# Patient Record
Sex: Male | Born: 1990 | Race: White | Hispanic: No | Marital: Married | Smoking: Current every day smoker
Health system: Southern US, Community
[De-identification: ages and names within clinical notes are randomized; demographics above are authoritative.]

## PROBLEM LIST (undated history)

## (undated) DIAGNOSIS — G43909 Migraine, unspecified, not intractable, without status migrainosus: Secondary | ICD-10-CM

## (undated) DIAGNOSIS — F419 Anxiety disorder, unspecified: Secondary | ICD-10-CM

## (undated) DIAGNOSIS — K219 Gastro-esophageal reflux disease without esophagitis: Secondary | ICD-10-CM

## (undated) HISTORY — DX: Anxiety disorder, unspecified: F41.9

## (undated) HISTORY — DX: Migraine, unspecified, not intractable, without status migrainosus: G43.909

## (undated) HISTORY — DX: Gastro-esophageal reflux disease without esophagitis: K21.9

---

## 2009-09-19 ENCOUNTER — Emergency Department: Payer: Self-pay | Admitting: Unknown Physician Specialty

## 2010-09-04 ENCOUNTER — Emergency Department: Payer: Self-pay | Admitting: Internal Medicine

## 2011-07-17 LAB — COMPREHENSIVE METABOLIC PANEL
Albumin: 4.9 g/dL (ref 3.4–5.0)
Alkaline Phosphatase: 73 U/L (ref 50–136)
Anion Gap: 11 (ref 7–16)
BUN: 7 mg/dL (ref 7–18)
Bilirubin,Total: 0.8 mg/dL (ref 0.2–1.0)
Chloride: 104 mmol/L (ref 98–107)
Creatinine: 1 mg/dL (ref 0.60–1.30)
EGFR (African American): >60
Glucose: 76 mg/dL (ref 65–99)
Potassium: 3.8 mmol/L (ref 3.5–5.1)
SGOT(AST): 16 U/L (ref 15–37)
SGPT (ALT): 21 U/L
Total Protein: 8.6 g/dL — ABNORMAL HIGH (ref 6.4–8.2)

## 2011-07-17 LAB — ETHANOL
Ethanol %: 0.065 % (ref 0.000–0.080)
Ethanol: 65 mg/dL

## 2011-07-17 LAB — DRUG SCREEN, URINE
Amphetamines, Ur Screen: NEGATIVE (ref ?–1000)
Benzodiazepine, Ur Scrn: NEGATIVE (ref ?–200)
MDMA (Ecstasy)Ur Screen: NEGATIVE (ref ?–500)
Opiate, Ur Screen: NEGATIVE (ref ?–300)
Phencyclidine (PCP) Ur S: NEGATIVE (ref ?–25)

## 2011-07-17 LAB — CBC
HGB: 15.9 g/dL (ref 13.0–18.0)
MCH: 31 pg (ref 26.0–34.0)
MCHC: 33.7 g/dL (ref 32.0–36.0)
MCV: 92 fL (ref 80–100)
Platelet: 275 10*3/uL (ref 150–440)
RBC: 5.11 10*6/uL (ref 4.40–5.90)
RDW: 13.6 % (ref 11.5–14.5)

## 2011-07-17 LAB — SALICYLATE LEVEL: Salicylates, Serum: <1.7 mg/dL

## 2011-07-18 ENCOUNTER — Inpatient Hospital Stay: Payer: Self-pay | Admitting: Psychiatry

## 2012-01-18 ENCOUNTER — Emergency Department: Payer: Self-pay | Admitting: Unknown Physician Specialty

## 2013-03-22 ENCOUNTER — Ambulatory Visit: Payer: Self-pay | Admitting: Emergency Medicine

## 2013-12-05 ENCOUNTER — Ambulatory Visit: Payer: Self-pay | Admitting: Emergency Medicine

## 2013-12-05 LAB — COMPREHENSIVE METABOLIC PANEL
Albumin: 4.1 g/dL (ref 3.4–5.0)
Alkaline Phosphatase: 80 U/L
Anion Gap: 6 — ABNORMAL LOW (ref 7–16)
BUN: 12 mg/dL (ref 7–18)
Bilirubin,Total: 0.3 mg/dL (ref 0.2–1.0)
Calcium, Total: 9.3 mg/dL (ref 8.5–10.1)
Chloride: 103 mmol/L (ref 98–107)
Co2: 32 mmol/L (ref 21–32)
Creatinine: 0.81 mg/dL (ref 0.60–1.30)
EGFR (African American): >60
EGFR (Non-African Amer.): >60
Glucose: 99 mg/dL (ref 65–99)
Osmolality: 281 (ref 275–301)
Potassium: 3.8 mmol/L (ref 3.5–5.1)
SGOT(AST): 22 U/L (ref 15–37)
SGPT (ALT): 32 U/L (ref 12–78)
Sodium: 141 mmol/L (ref 136–145)
Total Protein: 7 g/dL (ref 6.4–8.2)

## 2013-12-05 LAB — CBC WITH DIFFERENTIAL/PLATELET
Basophil #: 0.1 10*3/uL (ref 0.0–0.1)
Basophil %: 1.1 %
Eosinophil #: 0.1 10*3/uL (ref 0.0–0.7)
Eosinophil %: 1.4 %
HCT: 39.5 % — ABNORMAL LOW (ref 40.0–52.0)
HGB: 13.2 g/dL (ref 13.0–18.0)
Lymphocyte #: 1.8 10*3/uL (ref 1.0–3.6)
Lymphocyte %: 28.8 %
MCH: 29.9 pg (ref 26.0–34.0)
MCHC: 33.5 g/dL (ref 32.0–36.0)
MCV: 89 fL (ref 80–100)
Monocyte #: 0.2 x10 3/mm (ref 0.2–1.0)
Monocyte %: 3.6 %
Neutrophil #: 4 10*3/uL (ref 1.4–6.5)
Neutrophil %: 65.1 %
Platelet: 233 10*3/uL (ref 150–440)
RBC: 4.43 10*6/uL (ref 4.40–5.90)
RDW: 13.2 % (ref 11.5–14.5)
WBC: 6.1 10*3/uL (ref 3.8–10.6)

## 2013-12-05 LAB — AMYLASE: Amylase: 47 U/L (ref 25–115)

## 2013-12-05 LAB — LIPASE, BLOOD: Lipase: 108 U/L (ref 73–393)

## 2014-11-02 NOTE — Consult Note (Signed)
Psychological Assessment  Max Levinsucker Best22of Evaluation: 1-7-13Administered: [facility] Multiphasic Personality Inventory-2 (MMPI-2) for Referral: [name] was referred for a psychological assessment by his physician, Jolanta Pucilowska.  He was admitted to Behavioral Medicine for treatment of suicidal ideation. Please see the history and physical and psychosocial history for further background information. An assessment of personality structure was requested. [name]?s MMPI-2 protocol is compared to that of other adult males he obtained the following profile: 34?[phone]:#. The MMPI-2 validity scales indicate that the clinical profile is valid. They also suggest that he has adjusted to the experience of chronic problems and is not currently in significant distress. Presentation Moods: He reports a mild level of emotional distress that is characterized by a general dissatisfaction with life and a dysphoric mood. He frequently worries about something. He will use indirect, passive means of expressing his anger. He reports that his attention, concentration and memory seem to be all right. He has strong opinions that he expresses directly to other people. He likes to let people know where he stands on things and he finds it necessary to stand up for what he thinks is right or if people do something that makes him angry. He is able to make decisions easily. At times his thoughts have raced ahead faster than he could speak them. He sometimes thinks that difficulties are piling up so high that he cannot overcome them. He does not analyze the motives for his own or others? feelings and behavior. He believes that it is safe to trust others.  Relations: He reports that he is extroverted. He is very sociable and makes friends quickly. He likes making decisions and assigning jobs to others and believes, if given the chance, he would make a good leader of people. He enjoys social gatherings and parties and the  excitement of a crowd. He finds it easy to talk when he meets new people or is in a group of people. He is concerned about what others think of him.  Problem Areas: He reports little concern about his health. He does not wake up fresh and rested most mornings. He tires quickly. At times he is all full of energy. He usually has enough energy to do his work. He is likely to abuse alcohol or drugs so a careful review should be made of the consequences of his alcohol and drug use. He may have had behavioral problems while he was in school and has been in trouble with the law.  His prognosis is very poor because his problems are characterologic and not readily amenable to change. He is experiencing minimal emotional distress and has little concern about his behavior, limiting his motivation for treatment. His tendency not to analyze the motives for his own or others? behavior complicates most interventions. Short-term, behavioral interventions that focus on his reasons for entering treatment will be most effective.   Diagnostic Impression:Mood Disorder NOSDisorder NOS with histrionic featuresof substance abuse   Electronic Signatures: Carola FrostRoush, Lee Ann (PsyD, HSP-P)  (Signed on 08-Jan-13 12:25)  Authored  Last Updated: 08-Jan-13 12:25 by Carola Frostoush, Lee Ann (PsyD, HSP-P)

## 2014-11-02 NOTE — H&P (Signed)
PATIENT NAME:  Max Wright, Max Wright MR#:  [id number] DATE OF BIRTH:  [birth year]  DATE OF ADMISSION:  07/18/2011  REFERRING PHYSICIAN: Dr. Daryel November.   ATTENDING PHYSICIAN: Dr. Braulio Conte B. Taylor Levick.   IDENTIFYING DATA: Max Wright is a 24 year old male with history of depression and substance abuse.   CHIEF COMPLAINT: "I was drunk."   HISTORY OF PRESENT ILLNESS: Max Wright was petitioned by his wife. Reportedly, prior to admission he was on a three-day cocaine binge; and  on the day of admission he got drunk on a bottle of wine and started threatening to kill himself with a knife. He reportedly talked to his family to say that they would not see him again. The wife took papers out. The patient reports that he has been under considerable stress for the past several months since their baby was born four months ago. He is afraid that the wife will leave him with the baby. The relationship is quite strained because of the baby. He continues to worry about her being unfaithful and financial problems. He reports poor sleep, decreased appetite, anhedonia, social isolation, feelings of guilt, hopelessness, worthlessness, poor memory and concentration, and heightened anxiety that culminates in panic attacks with palpitations, swelling, feeling of impending doom, difficulties breathing, that last from several minutes to hours. He reports that recently he relapsed on cocaine, which made his anxiety even worse. He denies frequent drinking and does not feel that alcohol is a problem. He denies illicit substance use except that he has been using marijuana daily. He denies psychotic symptoms but endorses symptoms suggestive of mood instability with mood swings, irritability, hyperactivity, insomnia, agitated behavior. He denies physical violence.   PAST PSYCHIATRIC HISTORY: He has been depressed since at least the age of 52. He was diagnosed with depression at the age of 57. He was tried on several medications, including  Zoloft, Prozac, Wellbutrin and Effexor. He feels that Effexor has been helpful in the past but makes him oversedated, and it is hard for him to work. He has not been on Effexor for a while. It was prescribed by Dr. Koren Bound at [facility], but the patient has no insurance and no longer can afford doctor's visits or medication. He denies ever being hospitalized. There were no suicide attempts. He has never been in substance abuse treatment.  FAMILY PSYCHIATRIC HISTORY:  He feels that his sister may be bipolar.   PAST MEDICAL HISTORY: None.   ALLERGIES: No known drug allergies.   MEDICATIONS ON ADMISSION: None.   SOCIAL HISTORY: He dropped out in the ninth grade and did not like school. He felt anxious, was skipping school a lot. He did get his GED. He has known his current wife for a year and a half. They have a 10-month-old baby son. 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 He lost this job several months ago and was rehired just last week. He is uncertain whether or not his sister will hire him back as he says, "She is my sister when she is my sister and my boss at work." His wife is employed. He considers moving in with his parents if his relationship with the wife does not improve. It is unclear what the problem is. He does not indicate that the wife has been inappropriate, rather it seems that his own behavior contributes to marital problems and financial ones.   REVIEW OF SYSTEMS: CONSTITUTIONAL: No fevers or chills. No weight changes. EYES: No double or  blurred vision. ENT: No hearing loss. RESPIRATORY: No shortness of breath or cough. CARDIOVASCULAR: No chest pain or orthopnea. GASTROINTESTINAL: No abdominal pain, nausea, vomiting, or diarrhea. GU: No incontinence or frequency. ENDOCRINE: No heat or cold intolerance. LYMPHATIC: No anemia or easy bruising. INTEGUMENTARY: No acne or rash. MUSCULOSKELETAL: No muscle or joint pain. NEUROLOGICAL: No tingling or weakness.  PSYCHIATRIC: See history of present illness for details.   PHYSICAL EXAMINATION:  VITAL SIGNS: Blood pressure 131/70, pulse 72, respirations 18, temperature 97.3.   GENERAL: This is a slender, anxious-appearing male in no acute distress.   HEENT: The pupils are equal, round, and reactive to light. Sclerae are anicteric.   NECK: Supple. No thyromegaly.   LUNGS: Clear to auscultation. No dullness to percussion.   HEART: Regular rhythm and rate. No murmurs, rubs, or gallops.  ABDOMEN: Soft, nontender, and nondistended.   MUSCULOSKELETAL: Normal muscle strength in all extremities.   SKIN: No rashes or bruises.   LYMPHATIC: No cervical adenopathy.   NEUROLOGICAL: Cranial nerves II through XII are intact.   LABORATORY, DIAGNOSTIC AND RADIOLOGICAL DATA:  Chemistries are within normal limits.  Blood alcohol level is 0.065.  LFTs are within normal limits. TSH is 0.653.  Urine toxicology screen positive for cocaine and cannabinoids.  CBC within normal limits.  Serum acetaminophen and salicylates are low.   MENTAL STATUS EXAMINATION ON ADMISSION: The patient is alert and oriented to person, place, time, and situation. He is pleasant, polite, and cooperative. He is well groomed and casually dressed. He maintains good eye contact. His speech is of normal rhythm, rate, and volume. Mood is anxious with full affect. Thought processing is logical and goal oriented. Thought content: He denies suicidal or homicidal ideation but was admitted after threatening to hurt himself with a knife while drunk. There are no delusions or paranoia. There are no auditory or visual hallucinations. His cognition is grossly intact. He registers three out of three and recalls three out of three objects after five minutes. He can spell world forward and backward. He knows three past Presidents. His insight and judgment seem fair today.   SUICIDE RISK ASSESSMENT ON ADMISSION: This is a patient with a long history of  depression, mood instability and substance abuse, who threatened suicide while drunk.   ASSESSMENT:  AXIS I:  1. Mood disorder, not otherwise specified.  2. Alcohol abuse.  3. Cocaine abuse.   AXIS II: Deferred.   AXIS III: None.   AXIS IV: Mental illness, treatment compliance, substance abuse, marital, financial, employment.   AXIS V: Global Assessment of Functioning score on admission 25.   PLAN: The patient was admitted to [facility] Medicine Unit for safety, stabilization and medication management. He was initially placed on suicide precautions and was closely monitored for any unsafe behaviors. He underwent full psychiatric and risk assessment. He received pharmacotherapy, individual and group psychotherapy, substance abuse counseling, and support from therapeutic milieu.   1. Suicidal ideation: This has resolved. The patient is able to contract for safety.  2. Alcohol detox: Dr. Guss Bunde,  who admitted the patient, placed him on a standard CIWA protocol. He did not receive any doses of Ativan. His vital signs are stable.  3. Mood: The patient tried several antidepressants in the past. Except for Effexor, nothing ever worked. He, however, feels that Effexor makes him too sleepy to work and costs over $100. He is unable to afford it at the moment. He endorses symptoms suggestive of mood instability. He agreed  to try Tegretol for mood stabilization.  4. Insomnia: We will start him on trazodone for sleep.  5. Substance abuse treatment: The patient has a long history of substance abuse, admits to smoking marijuana daily. He minimizes the role of substance abuse in his problems and declines treatment.  6. Diagnostic clarification: We will offer MMPI.  7. Family problems: We will attempt to have a family session with his wife.  8. Disposition: He will be discharged to home. He will need an appointment with a psychiatrist and a therapist, if possible.    ____________________________ Ellin GoodieJolanta B. Jennet MaduroPucilowska, MD jbp:cbb D: 07/18/2011 18:14:10 ET T: 07/18/2011 19:51:20 ET JOB#: [id number]  cc: Ebin Palazzi B. Jennet MaduroPucilowska, MD, <Dictator> Shari ProwsJOLANTA B Vivianna Piccini MD ELECTRONICALLY SIGNED 07/20/2011 23:35

## 2016-02-15 ENCOUNTER — Emergency Department
Admission: EM | Admit: 2016-02-15 | Discharge: 2016-02-15 | Disposition: A | Discharge status: Home / Self Care | Payer: BLUE CROSS/BLUE SHIELD | Attending: Emergency Medicine | Admitting: Emergency Medicine

## 2016-02-15 ENCOUNTER — Encounter: Payer: Self-pay | Admitting: Emergency Medicine

## 2016-02-15 DIAGNOSIS — T7840XA Allergy, unspecified, initial encounter: Secondary | ICD-10-CM | POA: Insufficient documentation

## 2016-02-15 DIAGNOSIS — R21 Rash and other nonspecific skin eruption: Secondary | ICD-10-CM | POA: Diagnosis present

## 2016-02-15 DIAGNOSIS — L509 Urticaria, unspecified: Secondary | ICD-10-CM

## 2016-02-15 MED ORDER — PREDNISONE 10 MG PO TABS: 10.0000 mg | ORAL_TABLET | Freq: Two times a day (BID) | ORAL | 0 refills | Status: DC

## 2016-02-15 MED ORDER — FAMOTIDINE IN NACL 20-0.9 MG/50ML-% IV SOLN
20.0000 mg | Freq: Once | INTRAVENOUS | Status: AC
  Administered 2016-02-15: 20 mg via INTRAVENOUS

## 2016-02-15 MED ORDER — RANITIDINE HCL 150 MG PO TABS: 150.0000 mg | ORAL_TABLET | Freq: Two times a day (BID) | ORAL | 0 refills | Status: DC

## 2016-02-15 MED ORDER — SODIUM CHLORIDE 0.9 % IV BOLUS (SEPSIS)
500.0000 mL | Freq: Once | INTRAVENOUS | Status: AC
  Administered 2016-02-15: 500 mL via INTRAVENOUS

## 2016-02-15 MED ORDER — FAMOTIDINE IN NACL 20-0.9 MG/50ML-% IV SOLN
INTRAVENOUS | Status: AC
  Administered 2016-02-15: 20 mg via INTRAVENOUS
  Filled 2016-02-15: qty 50

## 2016-02-15 MED ORDER — DIPHENHYDRAMINE HCL 50 MG/ML IJ SOLN
50.0000 mg | Freq: Once | INTRAMUSCULAR | Status: AC
  Administered 2016-02-15: 50 mg via INTRAVENOUS
  Filled 2016-02-15: qty 1

## 2016-02-15 MED ORDER — HYDROXYZINE PAMOATE 25 MG PO CAPS: 25.0000 mg | ORAL_CAPSULE | Freq: Three times a day (TID) | ORAL | 0 refills | Status: DC | PRN

## 2016-02-15 MED ORDER — METHYLPREDNISOLONE SODIUM SUCC 125 MG IJ SOLR
125.0000 mg | Freq: Once | INTRAMUSCULAR | Status: AC
  Administered 2016-02-15: 125 mg via INTRAVENOUS
  Filled 2016-02-15: qty 2

## 2016-02-15 NOTE — ED Triage Notes (Signed)
Patient ambulatory to triage with steady gait, without difficulty or distress noted; pt reports being bit by possible fire ant PTA with noted some hives; denies any difficulty breathing or swallowing

## 2016-02-15 NOTE — Discharge Instructions (Signed)
You appear to have had an allergic reaction brought on by multiple insect bites. You have been treated with intravenous antihistamines and steroids. You will be discharged with prescriptions for the same medicines to take by mouth. Increase fluid intake and follow-up with your primary care provider for continued symptoms. Return to the ED for difficulty swallowing or breathing.

## 2016-02-15 NOTE — ED Notes (Signed)
Pt with much improvement; skin with mild rash on upper trunk and blt axillaries; non pink in appearance.

## 2016-02-16 NOTE — ED Provider Notes (Signed)
[facility] Emergency Department Provider Note ____________________________________________  Time seen: 830 PM  I have reviewed the triage vital signs and the nursing notes.  HISTORY  Chief Complaint  Rash  HPI Max Wright is a 25 y.o. male presents to the ED accompanied by his wife for evaluation of hives of an unknown cause. The patient describes that he was in the yard today when he was apparently bitten by 2 or 3 fire ants to his lower leg. With than about 20 minutes of that exposure, the patient developed generalized hives from the lower extremities to the torso extremities tease and neck. He denies any previous history of allergies, and denies any previous history of idiopathic hives. He denies any difficulty swallowing, breathing, and denies any swelling of the lips, throat, or tongue. He is not taking any medication prior to arrival.  History reviewed. No pertinent past medical history.  There are no active problems to display for this patient.  History reviewed. No pertinent surgical history.  Prior to Admission medications   Medication Sig Start Date End Date Taking? Authorizing Provider  hydrOXYzine (VISTARIL) 25 MG capsule Take 1 capsule (25 mg total) by mouth 3 (three) times daily as needed for itching. 02/15/16   Lucah Petta V Bacon Anav Lammert, PA-C  predniSONE (DELTASONE) 10 MG tablet Take 1 tablet (10 mg total) by mouth 2 (two) times daily with a meal. 02/15/16   Maxton Noreen V Bacon Deserai Cansler, PA-C  ranitidine (ZANTAC) 150 MG tablet Take 1 tablet (150 mg total) by mouth 2 (two) times daily. 02/15/16   Mikailah Morel V Bacon Burk Hoctor, PA-C    Allergies Review of patient's allergies indicates no known allergies.  No family history on file.  Social History Social History  Substance Use Topics  . Smoking status: Never Smoker  . Smokeless tobacco: Never Used  . Alcohol use No   Review of Systems  Constitutional: Negative for fever. Eyes: Negative for visual  changes. ENT: Negative for sore throat. Cardiovascular: Negative for chest pain. Respiratory: Negative for shortness of breath. Skin: Positive for global rash. Neurological: Negative for headaches, focal weakness or numbness. ____________________________________________  PHYSICAL EXAM:  VITAL SIGNS: ED Triage Vitals  Enc Vitals Group     BP 02/15/16 2015 (!) 160/73     Pulse Rate 02/15/16 2015 67     Resp 02/15/16 2015 18     Temp 02/15/16 2015 98.3 F (36.8 C)     Temp Source 02/15/16 2204 Oral     SpO2 02/15/16 2015 100 %     Weight 02/15/16 2015 175 lb (79.4 kg)     Height 02/15/16 2015 5\' 11"  (1.803 m)     Head Circumference --      Peak Flow --      Pain Score --      Pain Loc --      Pain Edu? --      Excl. in GC? --    Constitutional: Alert and oriented. Well appearing and in no distress. Head: Normocephalic and atraumatic.      Eyes: Conjunctivae are normal. PERRL. Normal extraocular movements      Ears: Canals clear. TMs intact bilaterally.   Nose: No congestion/rhinorrhea.   Mouth/Throat: Mucous membranes are moist. Uvula is midline and without edema. Tonsils are flat. Oropharynx is erythematous without angioedema.    Neck: Supple. No thyromegaly. Cardiovascular: Normal rate, regular rhythm.  Respiratory: Normal respiratory effort. No wheezes/rales/rhonchi. Musculoskeletal: Nontender with normal range of motion in all extremities.  Neurologic:  Normal speech and language. No gross focal neurologic deficits are appreciated. Skin:  Skin is warm, dry and intact. Patient with global large urticaria noted from the face, neck, extremities and torso. Psychiatric: Mood and affect are normal. Patient exhibits appropriate insight and judgment. ____________________________________________  PROCEDURES  NS 500 ml bolus Diphenhydramine 50 mg IVP Famotidine 20 mg IVPB Solumedrol 125 mg IVP ____________________________________________  INITIAL IMPRESSION /  ASSESSMENT AND PLAN / ED COURSE  Patient with significant improvement of his hives following a demonstration of IV medications. He is discharged with prescriptions for antihistamines including Vistaril and ranitidine. He is also provided with a 5 day course of prednisone to dose as directed. He will rest, hydrate, and avoid excessive sweating. We will also follow with his primary care provider for ongoing symptom management. A work note is provided for tomorrow as needed. He should return to the ED for any signs of difficulty swallowing and/or breathing.  Clinical Course   ____________________________________________  FINAL CLINICAL IMPRESSION(S) / ED DIAGNOSES  Final diagnoses:  Hives  Allergic reaction, initial encounter      Lissa Hoard, PA-C 02/16/16 0006    Myrna Blazer, MD 02/18/16 2337

## 2017-08-29 ENCOUNTER — Ambulatory Visit (INDEPENDENT_AMBULATORY_CARE_PROVIDER_SITE_OTHER): Payer: BLUE CROSS/BLUE SHIELD | Admitting: Internal Medicine

## 2017-08-29 ENCOUNTER — Encounter: Payer: Self-pay | Admitting: Internal Medicine

## 2017-08-29 VITALS — BP 140/90 | HR 56 | Temp 97.8°F | Ht 70.75 in | Wt 157.0 lb

## 2017-08-29 DIAGNOSIS — F419 Anxiety disorder, unspecified: Secondary | ICD-10-CM | POA: Insufficient documentation

## 2017-08-29 DIAGNOSIS — R1013 Epigastric pain: Secondary | ICD-10-CM | POA: Diagnosis not present

## 2017-08-29 DIAGNOSIS — Z23 Encounter for immunization: Secondary | ICD-10-CM

## 2017-08-29 DIAGNOSIS — G43909 Migraine, unspecified, not intractable, without status migrainosus: Secondary | ICD-10-CM | POA: Insufficient documentation

## 2017-08-29 DIAGNOSIS — R5383 Other fatigue: Secondary | ICD-10-CM | POA: Diagnosis not present

## 2017-08-29 DIAGNOSIS — R109 Unspecified abdominal pain: Secondary | ICD-10-CM | POA: Insufficient documentation

## 2017-08-29 DIAGNOSIS — K219 Gastro-esophageal reflux disease without esophagitis: Secondary | ICD-10-CM | POA: Insufficient documentation

## 2017-08-29 LAB — COMPREHENSIVE METABOLIC PANEL
ALBUMIN: 4.8 g/dL (ref 3.5–5.2)
ALT: 15 U/L (ref 0–53)
AST: 16 U/L (ref 0–37)
Alkaline Phosphatase: 57 U/L (ref 39–117)
BUN: 12 mg/dL (ref 6–23)
CALCIUM: 10.4 mg/dL (ref 8.4–10.5)
CHLORIDE: 102 meq/L (ref 96–112)
CO2: 31 mEq/L (ref 19–32)
Creatinine, Ser: 0.89 mg/dL (ref 0.40–1.50)
GFR: 107.98 mL/min (ref >60.00)
Glucose, Bld: 88 mg/dL (ref 70–99)
Potassium: 4.7 mEq/L (ref 3.5–5.1)
Sodium: 138 mEq/L (ref 135–145)
Total Bilirubin: 0.5 mg/dL (ref 0.2–1.2)
Total Protein: 7.3 g/dL (ref 6.0–8.3)

## 2017-08-29 LAB — CBC
HCT: 47.7 % (ref 39.0–52.0)
HEMOGLOBIN: 16.1 g/dL (ref 13.0–17.0)
MCHC: 33.7 g/dL (ref 30.0–36.0)
MCV: 91.3 fl (ref 78.0–100.0)
PLATELETS: 296 10*3/uL (ref 150.0–400.0)
RBC: 5.23 Mil/uL (ref 4.22–5.81)
RDW: 13.3 % (ref 11.5–15.5)
WBC: 3.2 10*3/uL — AB (ref 4.0–10.5)

## 2017-08-29 LAB — SEDIMENTATION RATE: Sed Rate: 1 mm/hr (ref 0–15)

## 2017-08-29 LAB — T4, FREE: FREE T4: 0.96 ng/dL (ref 0.60–1.60)

## 2017-08-29 MED ORDER — OMEPRAZOLE 20 MG PO CPDR: 20.0000 mg | DELAYED_RELEASE_CAPSULE | Freq: Two times a day (BID) | ORAL | 5 refills | Status: DC

## 2017-08-29 NOTE — Addendum Note (Signed)
Addended by: Eual FinesBRIDGES, SHANNON P on: 08/29/2017 02:29 PM   Modules accepted: Orders

## 2017-08-29 NOTE — Assessment & Plan Note (Signed)
Encouraged him to use the imitrex for headaches (he had been afraid)

## 2017-08-29 NOTE — Assessment & Plan Note (Signed)
Multifactorial but he has had weight loss, etc Will check labs Early follow up

## 2017-08-29 NOTE — Addendum Note (Signed)
Addended by: Alvina ChouWALSH, TERRI J on: 08/29/2017 12:34 PM   Modules accepted: Orders

## 2017-08-29 NOTE — Assessment & Plan Note (Signed)
Some better with citalopram but doesn't like how he feels Doesn't seem to be bipolar No MDD symptoms but some dysthymia  Will wean off the citalopram--if anxiety flares, will try duloxetine (or consider mirtazapine)

## 2017-08-29 NOTE — Patient Instructions (Signed)
Please stop smoking with a 21 or 22mg  nicotine patch daily. You can use a nicotine lozenge also--if you have urges (so you don't smoke a cigarette). Decrease the citalopram to 10mg  daily for 1 week, then 10mg  every other day for 1 week, then stop it. Let me know right away if your anxiety gets really bad. You can safely try the sumatriptan (imitrex) for your migraine headache. Start the twice a day omeprazole (on an empty stomach)-once you have given the fecal specimen. If you have ongoing stomach troubles, we will need to proceed with a GI evaluation.

## 2017-08-29 NOTE — Progress Notes (Signed)
Subjective:    Patient ID: Max Wright, male    DOB: Mar 28, 1991, 27 y.o.   MRN: [id number]  HPI Here to establish care Had been seeing [facility] in [place] Prefers not to be on medications and they were "throwing it at me"  Having bad anxiety--would actually be hyperventilating before going to work and social anxiety Put on citalopram---made him unable to focus ( and countered with adderall) Some past trouble with focusing --but no ADHD diagnosis Did feel the citalopram helped the anxiety  Had been on leave from work due to back injury--October 2018 (for about 2 months) Went back to work in December   Chronic GI problems---for past 7 years or so Thinks he had ulcers--though never had EGD Terrible pain after hot wings Takes the ranitidine bid now--this mostly controls symptoms but he still feels discomfort (though improved) Had tried prilosec ---not any better Has nausea in AM--- burning-- acid taste if vomits--and water brash Appetite is off on the citalopram (now down from 20-15mg )  FH of depression Some down times but no persistent depressed mood Concerned that he is actually worse with this on the citalopram Also used sertraline in his teens--didn't use this for long Wonders if dad was bipolar No clear mania by his description  Current Outpatient Medications on File Prior to Visit  Medication Sig Dispense Refill  . citalopram (CELEXA) 10 MG tablet Take 1.5 tabs daily    . ranitidine (ZANTAC) 150 MG tablet Take 1 tablet (150 mg total) by mouth 2 (two) times daily. 20 tablet 0   No current facility-administered medications on file prior to visit.     No Known Allergies  Past Medical History:  Diagnosis Date  . Anxiety disorder   . GERD (gastroesophageal reflux disease)     History reviewed. No pertinent surgical history.  Family History  Problem Relation Age of Onset  . COPD Father   . Alcohol abuse Father   . Heart disease  Father   . Depression Father   . Diabetes Maternal Uncle   . Peptic Ulcer Maternal Grandmother   . Peptic Ulcer Maternal Grandfather   . Heart disease Other     Social History   Socioeconomic History  . Marital status: Married    Spouse name: Not on file  . Number of children: 3  . Years of education: Not on file  . Highest education level: Not on file  Social Needs  . Financial resource strain: Not on file  . Food insecurity - worry: Not on file  . Food insecurity - inability: Not on file  . Transportation needs - medical: Not on file  . Transportation needs - non-medical: Not on file  Occupational History  . Occupation: Teacher, adult education: [id number]  Tobacco Use  . Smoking status: Current Every Day Smoker  . Smokeless tobacco: Never Used  Substance and Sexual Activity  . Alcohol use: No  . Drug use: Not on file  . Sexual activity: Not on file  Other Topics Concern  . Not on file  Social History Narrative  . Not on file   Review of Systems  Constitutional:       Has weight loss Feels fatigued   HENT: Negative for dental problem and trouble swallowing.   Eyes: Negative for visual disturbance.  Respiratory: Negative for cough, chest tightness and shortness of breath.   Cardiovascular: Negative for chest pain and leg swelling.       Palpitations  when on the adderall  Gastrointestinal: Positive for abdominal pain and nausea. Negative for blood in stool.  Endocrine: Positive for polydipsia. Negative for polyuria.  Genitourinary: Negative for difficulty urinating and urgency.  Musculoskeletal: Negative for arthralgias and joint swelling.       Lumbar sprain has resolved  Skin: Negative for rash.  Neurological: Positive for headaches. Negative for syncope.       Some orthostatic dizziness Gets pounding right sided headache --with photophobia. Got imitrex but hasn't tried it  Hematological: Negative for adenopathy. Does not bruise/bleed easily.    Psychiatric/Behavioral: Positive for dysphoric mood. The patient is nervous/anxious.        Sleeps okay but up at 3AM to get ready for work       Objective:   Physical Exam  Constitutional: He is oriented to person, place, and time. He appears well-developed. No distress.  HENT:  Mouth/Throat: Oropharynx is clear and moist. No oropharyngeal exudate.  Neck: No thyromegaly present.  Cardiovascular: Normal rate, regular rhythm, normal heart sounds and intact distal pulses. Exam reveals no gallop.  No murmur heard. Pulmonary/Chest: Effort normal and breath sounds normal. No respiratory distress. He has no wheezes. He has no rales.  Abdominal: Soft. He exhibits no distension. There is no rebound and no guarding.  Mild epigastric tenderness  Musculoskeletal: He exhibits no edema or tenderness.  Lymphadenopathy:    He has no cervical adenopathy.  Neurological: He is alert and oriented to person, place, and time.  Skin: No rash noted. No erythema.  Psychiatric: He has a normal mood and affect. His behavior is normal.          Assessment & Plan:

## 2017-08-29 NOTE — Assessment & Plan Note (Signed)
Symptoms are concerning for peptic ulcer--but also has reflux symptoms Discussed stopping cigarettes He has cut back on coffee Need to consider H pylori--- will check fecal assay If no H pylori, and empiric PPI doesn't help, will need GI referral

## 2017-08-30 ENCOUNTER — Telehealth: Payer: Self-pay | Admitting: Internal Medicine

## 2017-08-30 NOTE — Telephone Encounter (Signed)
Form completed $20 I added days in the future for if he misses more work

## 2017-08-30 NOTE — Telephone Encounter (Signed)
FMLA paperwork in dr Karle Starchletvak's in box For review and signature Spoke with pt.  He stated he will be out of work from 08/23/17 to 08/31/17 going back to work 2/25 for abdominal pain

## 2017-08-30 NOTE — Addendum Note (Signed)
Addended by: Alvina ChouWALSH, TERRI J on: 08/30/2017 12:23 PM   Modules accepted: Orders

## 2017-08-30 NOTE — Telephone Encounter (Signed)
Tried calling pt no voice mail set up. ? About fmla paperwork

## 2017-08-31 LAB — HELICOBACTER PYLORI  SPECIAL ANTIGEN
MICRO NUMBER: 90224663
SPECIMEN QUALITY: ADEQUATE

## 2017-08-31 NOTE — Telephone Encounter (Signed)
Pt aware.

## 2017-08-31 NOTE — Telephone Encounter (Signed)
Tried calling pt no voicemail Copy for scan Copy for pt Copy for billing

## 2017-08-31 NOTE — Telephone Encounter (Signed)
Paperwork faxed °

## 2017-09-05 ENCOUNTER — Encounter: Payer: Self-pay | Admitting: Internal Medicine

## 2017-09-21 ENCOUNTER — Ambulatory Visit (INDEPENDENT_AMBULATORY_CARE_PROVIDER_SITE_OTHER): Payer: BLUE CROSS/BLUE SHIELD | Admitting: Internal Medicine

## 2017-09-21 ENCOUNTER — Encounter: Payer: Self-pay | Admitting: Internal Medicine

## 2017-09-21 VITALS — BP 118/88 | HR 70 | Temp 97.9°F | Wt 160.0 lb

## 2017-09-21 DIAGNOSIS — K219 Gastro-esophageal reflux disease without esophagitis: Secondary | ICD-10-CM

## 2017-09-21 DIAGNOSIS — F419 Anxiety disorder, unspecified: Secondary | ICD-10-CM

## 2017-09-21 NOTE — Assessment & Plan Note (Signed)
Seems okay for now No meds now If worsens, would probably give trial of duloxetine

## 2017-09-21 NOTE — Progress Notes (Signed)
   Subjective:    Patient ID: Max Wright, male    DOB: 10/06/1990, 27 y.o.   MRN: [id number]  HPI Here for follow up of anxiety and stomach pain  Did completely get off the citalopram Stopped quickly from 15mg  Slight withdrawal (fatigue) and feels better now  Stomach seems better Still has to avoid spicy foods and alcohol Continues on the ranitidine  He has chronic anxiety He feels a little better off the medication Wife feels he is more "chipper" and more energy  Current Outpatient Medications on File Prior to Visit  Medication Sig Dispense Refill  . ranitidine (ZANTAC) 150 MG tablet Take 1 tablet (150 mg total) by mouth 2 (two) times daily. 20 tablet 0  . SUMAtriptan (IMITREX) 100 MG tablet Take 100 mg by mouth every 2 (two) hours as needed for migraine. May repeat in 2 hours if headache persists or recurs.     No current facility-administered medications on file prior to visit.     No Known Allergies  Past Medical History:  Diagnosis Date  . Anxiety disorder   . GERD (gastroesophageal reflux disease)   . Migraine syndrome     History reviewed. No pertinent surgical history.  Family History  Problem Relation Age of Onset  . COPD Father   . Alcohol abuse Father   . Heart disease Father   . Depression Father   . Diabetes Maternal Uncle   . Peptic Ulcer Maternal Grandmother   . Peptic Ulcer Maternal Grandfather   . Heart disease Other     Social History   Socioeconomic History  . Marital status: Married    Spouse name: Not on file  . Number of children: 3  . Years of education: Not on file  . Highest education level: Not on file  Social Needs  . Financial resource strain: Not on file  . Food insecurity - worry: Not on file  . Food insecurity - inability: Not on file  . Transportation needs - medical: Not on file  . Transportation needs - non-medical: Not on file  Occupational History  . Occupation: Plumbing   . Occupation: Carpentry on the side    Tobacco Use  . Smoking status: Current Every Day Smoker  . Smokeless tobacco: Never Used  Substance and Sexual Activity  . Alcohol use: No  . Drug use: Not on file  . Sexual activity: Not on file  Other Topics Concern  . Not on file  Social History Narrative   Married   3 children and 1 on the way   Review of Systems  Had migraine yesterday--better with imitrex and caffeine Appetite is okay Weight back is up a few pounds Built greenhouse--goes there to meditate    Objective:   Physical Exam  Constitutional: He appears well-developed. No distress.  Psychiatric:  Normal speech and appearance  No depression  Not anxious here now          Assessment & Plan:

## 2017-09-21 NOTE — Assessment & Plan Note (Signed)
No clear gastritis and pain is better Urged him to stay on the ranitidine

## 2017-09-29 ENCOUNTER — Ambulatory Visit (INDEPENDENT_AMBULATORY_CARE_PROVIDER_SITE_OTHER): Payer: BLUE CROSS/BLUE SHIELD | Admitting: Urology

## 2017-09-29 ENCOUNTER — Encounter: Payer: Self-pay | Admitting: Urology

## 2017-09-29 ENCOUNTER — Ambulatory Visit: Payer: BLUE CROSS/BLUE SHIELD | Admitting: Internal Medicine

## 2017-09-29 VITALS — BP 136/84 | HR 91 | Resp 16 | Ht 70.0 in | Wt 160.0 lb

## 2017-09-29 DIAGNOSIS — Z3009 Encounter for other general counseling and advice on contraception: Secondary | ICD-10-CM | POA: Diagnosis not present

## 2017-09-29 NOTE — Progress Notes (Signed)
09/29/2017 4:13 PM   Max Wright 1991/06/21 [id number]  Referring provider: Karie Schwalbe, MD [address] [place], [place] [address]  Chief complaint: Vasectomy  HPI: Max Wright is a 27 year old male who is married with 4 children.  He states he and his wife desire vasectomy as a means of permanent sterilization.  He denies prior history of urologic problems and specifically denies chronic scrotal pain or history of epididymitis.  He has had no previous surgeries.   PMH: Past Medical History:  Diagnosis Date  . Anxiety disorder   . GERD (gastroesophageal reflux disease)   . Migraine syndrome     Surgical History: History reviewed. No pertinent surgical history.  Home Medications:  Allergies as of 09/29/2017   No Known Allergies     Medication List    as of 09/29/2017  4:13 PM   You have not been prescribed any medications.     Allergies: No Known Allergies  Family History: Family History  Problem Relation Age of Onset  . COPD Father   . Alcohol abuse Father   . Heart disease Father   . Depression Father   . Diabetes Maternal Uncle   . Peptic Ulcer Maternal Grandmother   . Peptic Ulcer Maternal Grandfather   . Heart disease Other     Social History:  reports that he has been smoking.  He has never used smokeless tobacco. He reports that he does not drink alcohol. His drug history is not on file.  ROS: UROLOGY Frequent Urination?: No Hard to postpone urination?: No Burning/pain with urination?: No Get up at night to urinate?: No Leakage of urine?: No Urine stream starts and stops?: No Trouble starting stream?: No Do you have to strain to urinate?: No Blood in urine?: No Urinary tract infection?: No Sexually transmitted disease?: No Injury to kidneys or bladder?: No Painful intercourse?: No Weak stream?: No Erection problems?: No Penile pain?: No  Gastrointestinal Nausea?: No Vomiting?: No Indigestion/heartburn?: Yes Diarrhea?:  No Constipation?: No  Constitutional Fever: No Night sweats?: No Weight loss?: No Fatigue?: No  Skin Skin rash/lesions?: No Itching?: No  Eyes Blurred vision?: No Double vision?: No  Ears/Nose/Throat Sore throat?: No Sinus problems?: No  Hematologic/Lymphatic Swollen glands?: No Easy bruising?: No  Cardiovascular Leg swelling?: No Chest pain?: No  Respiratory Cough?: No Shortness of breath?: No  Endocrine Excessive thirst?: No  Musculoskeletal Back pain?: No Joint pain?: No  Neurological Headaches?: No Dizziness?: No  Psychologic Depression?: No Anxiety?: No  Physical Exam: BP 136/84   Pulse 91   Resp 16   Ht 5\' 10"  (1.778 m)   Wt 160 lb (72.6 kg)   SpO2 98%   BMI 22.96 kg/m   Constitutional:  Alert and oriented, No acute distress. HEENT: [place] AT, moist mucus membranes.  Trachea midline, no masses. Cardiovascular: No clubbing, cyanosis, or edema. Respiratory: Normal respiratory effort, no increased work of breathing. GI: Abdomen is soft, nontender, nondistended, no abdominal masses GU: No CVA tenderness.  Penis without lesions, testes descended bilaterally without masses or tenderness, no paratesticular abnormalities.  Vasa palpable bilaterally. Lymph: No cervical or inguinal lymphadenopathy. Skin: No rashes, bruises or suspicious lesions. Neurologic: Grossly intact, no focal deficits, moving all 4 extremities. Psychiatric: Normal mood and affect.   Assessment & Plan:   27 year old male with undesired fertility who desires to proceed with vasectomy.   We had a long discussion about vasectomy. We specifically discussed the procedure, recovery and the risks, benefits and alternatives of  vasectomy. I explained that the procedure entails removal of a segment of each vas deferens, each of which conducts sperm, and that the purpose of this procedure is to cause sterility (inability to produce children or cause pregnancy). Vasectomy is intended to be  permanent and irreversible form of contraception. Options for fertility after vasectomy include vasectomy reversal, or sperm retrieval with in vitro fertilization. These options are not always successful, and they may be expensive. We discussed reversible forms of birth control such as condoms, IUD or diaphragms, as well as the option of freezing sperm in a sperm bank prior to the vasectomy procedure. We discussed the importance of avoiding strenuous exercise for four days after vasectomy, and the importance of refraining from any form of ejaculation for seven days after vasectomy. I explained that vasectomy does not produce immediate sterility so another form of contraceptive must be used until sterility is assured by having semen checked for sperm. Thus, a post vasectomy semen analysis is necessary to confirm sterility. Rarely, vasectomy must be repeated. We discussed the approximately 1 in 2,000 risk of pregnancy after vasectomy for men who have post-vasectomy semen analysis showing absent sperm or rare non-motile sperm. Typical side effects include a small amount of oozing blood, some discomfort and mild swelling in the area of incision.  Vasectomy does not affect sexual performance, function, please, sensation, interest, desire, satisfaction, penile erection, volume of semen or ejaculation. Other rare risks include allergy or adverse reaction to an anesthetic, testicular atrophy, hematoma, infection/abscess, prolonged tenderness of the vas deferens, pain, swelling, painful nodule or scare (called sperm granuloma) or epididymtis. We discussed chronic testicular pain syndrome. This has been reported to occur in as many as 1-2% of men and may be permanent. This can be treated with medication, small procedures or (rarely) surgery.   Max AltesScott C Magdaline Zollars, MD  [facility] [address], [address] [place], [place] [address] [phone]

## 2017-10-01 ENCOUNTER — Encounter: Payer: Self-pay | Admitting: Urology

## 2017-10-01 MED ORDER — DIAZEPAM 2 MG PO TABS: ORAL_TABLET | ORAL | 0 refills | Status: DC

## 2017-10-06 ENCOUNTER — Encounter: Payer: BLUE CROSS/BLUE SHIELD | Admitting: Urology

## 2017-11-03 ENCOUNTER — Encounter: Payer: BLUE CROSS/BLUE SHIELD | Admitting: Urology

## 2018-09-28 ENCOUNTER — Encounter: Payer: Medicaid Other | Admitting: Internal Medicine

## 2019-02-01 ENCOUNTER — Telehealth: Payer: Self-pay

## 2019-02-01 NOTE — Telephone Encounter (Signed)
Pt was exposed to person with positive covid test on 01/24/19. Pt and other person was not wearing a mask; pt has prod cough with dark brown phlegm for 1 month  but pt is a smoker. No other covid symptoms except diarrhea on 01/30/19 that lasted 12 hours. No travel. Pt wonders if should get tested for covid. Pt request cb.

## 2019-02-01 NOTE — Telephone Encounter (Signed)
Patient called back and asked about an sites testing on Saturday.  Advised patient about Saturday mobile testing site going on this Saturday 7/25 from 8-11 at [employer] in [place]. He stated he would be going there. P atient also asked about quarantine and was advised to quarantine himself until those results come back

## 2019-02-01 NOTE — Telephone Encounter (Signed)
Left message on VM per DPR with the 2 drive by locations in [place] and [place]. Also mentioned [facility] in case he missed the time today for drive up testing.

## 2019-02-01 NOTE — Telephone Encounter (Signed)
It would be good to be tested Please direct him to his closest drive by site

## 2019-02-02 ENCOUNTER — Other Ambulatory Visit: Payer: Self-pay

## 2019-02-02 DIAGNOSIS — Z20822 Contact with and (suspected) exposure to covid-19: Secondary | ICD-10-CM

## 2019-02-05 LAB — NOVEL CORONAVIRUS, NAA: SARS-CoV-2, NAA: NOT DETECTED

## 2019-02-11 ENCOUNTER — Ambulatory Visit: Payer: Medicaid Other | Admitting: Internal Medicine

## 2020-01-11 ENCOUNTER — Encounter: Payer: Self-pay | Admitting: Emergency Medicine

## 2020-01-11 ENCOUNTER — Other Ambulatory Visit: Payer: Self-pay

## 2020-01-11 DIAGNOSIS — Z5321 Procedure and treatment not carried out due to patient leaving prior to being seen by health care provider: Secondary | ICD-10-CM | POA: Insufficient documentation

## 2020-01-11 DIAGNOSIS — R109 Unspecified abdominal pain: Secondary | ICD-10-CM | POA: Insufficient documentation

## 2020-01-11 MED ORDER — FAMOTIDINE 20 MG PO TABS
20.0000 mg | ORAL_TABLET | Freq: Once | ORAL | Status: AC
  Administered 2020-01-11: 20 mg via ORAL
  Filled 2020-01-11: qty 1

## 2020-01-11 MED ORDER — ALUM & MAG HYDROXIDE-SIMETH 200-200-20 MG/5ML PO SUSP
30.0000 mL | Freq: Once | ORAL | Status: AC
  Administered 2020-01-11: 30 mL via ORAL
  Filled 2020-01-11: qty 30

## 2020-01-11 NOTE — ED Notes (Signed)
D/w Dr. Marisa Severin, new orders received for pepcid and maalox.

## 2020-01-11 NOTE — ED Triage Notes (Addendum)
Pt arrived via POV with reports of right side abdominal pain radiating to the umbilical region that started about 1 hour ago after eating dominoes. Pt still has GB and appendix, states he has hx of stomach ulcers.  Pt states the pain started this morning and states he thought it was gas pain, states he took gas med around 11am and again around 6pm.  Pt admits to drinking alcohol today.  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

## 2020-01-12 ENCOUNTER — Emergency Department
Admission: EM | Admit: 2020-01-12 | Discharge: 2020-01-12 | Disposition: A | Discharge status: Home / Self Care | Payer: Medicaid Other | Attending: Emergency Medicine | Admitting: Emergency Medicine

## 2020-01-12 NOTE — ED Notes (Signed)
Pt not visualized in ED did not communicate with staff that he would be leaving.

## 2020-01-12 NOTE — ED Notes (Signed)
Called for room no answer x 1 

## 2020-01-15 ENCOUNTER — Telehealth: Payer: Self-pay | Admitting: *Deleted

## 2020-01-15 NOTE — Telephone Encounter (Signed)
Spoke to pt

## 2020-01-15 NOTE — Telephone Encounter (Signed)
They must have drawn blood but didn't send it after he left. He should keep the appointment and we can decide if further testing is needed

## 2020-01-15 NOTE — Telephone Encounter (Signed)
Patient called stating that he went to the ER July 4th, but left because the wait was too long. Patient stated that they did lab work and checked his urine. Patient stated that he left without getting the results and was hoping that Dr. Alphonsus Sias could review the results. Patient stated that he looked on mychart and did not see any results. Patient stated that he is still having some right side flank pain under his rib area. Patient stated that it feels like gas at times. Patient stated that he thought that it may be his liver so he has stopped drinking alcohol. Patient stated that he is urinating find and denies any pain, urgency or frequency. Patient stated that his urine does have an odor to it. Patient was offered an appointment Thursday which he declined stating that he can not come in tomorrow. Patient scheduled Friday 01/17/20 at 7:30 am with Dr. Alphonsus Sias. Patient was given ER precautions.

## 2020-01-17 ENCOUNTER — Ambulatory Visit
Admission: RE | Admit: 2020-01-17 | Discharge: 2020-01-17 | Disposition: A | Discharge status: Home / Self Care | Payer: Self-pay | Source: Ambulatory Visit | Attending: Internal Medicine | Admitting: Internal Medicine

## 2020-01-17 ENCOUNTER — Other Ambulatory Visit: Payer: Self-pay

## 2020-01-17 ENCOUNTER — Ambulatory Visit: Payer: Self-pay | Admitting: Internal Medicine

## 2020-01-17 ENCOUNTER — Encounter: Payer: Self-pay | Admitting: Internal Medicine

## 2020-01-17 ENCOUNTER — Telehealth: Payer: Self-pay

## 2020-01-17 DIAGNOSIS — R10811 Right upper quadrant abdominal tenderness: Secondary | ICD-10-CM | POA: Insufficient documentation

## 2020-01-17 LAB — COMPREHENSIVE METABOLIC PANEL
ALT: 17 U/L (ref 0–53)
AST: 18 U/L (ref 0–37)
Albumin: 5.1 g/dL (ref 3.5–5.2)
Alkaline Phosphatase: 64 U/L (ref 39–117)
BUN: 16 mg/dL (ref 6–23)
CO2: 29 mEq/L (ref 19–32)
Calcium: 9.9 mg/dL (ref 8.4–10.5)
Chloride: 101 mEq/L (ref 96–112)
Creatinine, Ser: 0.99 mg/dL (ref 0.40–1.50)
GFR: 88.38 mL/min (ref >60.00)
Glucose, Bld: 91 mg/dL (ref 70–99)
Potassium: 4.6 mEq/L (ref 3.5–5.1)
Sodium: 138 mEq/L (ref 135–145)
Total Bilirubin: 0.6 mg/dL (ref 0.2–1.2)
Total Protein: 7.1 g/dL (ref 6.0–8.3)

## 2020-01-17 LAB — CBC
HCT: 47.3 % (ref 39.0–52.0)
Hemoglobin: 16.4 g/dL (ref 13.0–17.0)
MCHC: 34.7 g/dL (ref 30.0–36.0)
MCV: 92.4 fl (ref 78.0–100.0)
Platelets: 250 10*3/uL (ref 150.0–400.0)
RBC: 5.11 Mil/uL (ref 4.22–5.81)
RDW: 13.7 % (ref 11.5–15.5)
WBC: 4.8 10*3/uL (ref 4.0–10.5)

## 2020-01-17 LAB — LIPASE: Lipase: 17 U/L (ref 11.0–59.0)

## 2020-01-17 MED ORDER — AMOXICILLIN-POT CLAVULANATE 875-125 MG PO TABS: 1.0000 | ORAL_TABLET | Freq: Two times a day (BID) | ORAL | 0 refills | Status: DC

## 2020-01-17 NOTE — Progress Notes (Signed)
Subjective:    Patient ID: Max Wright, male    DOB: Jan 24, 1991, 29 y.o.   MRN: [id number]  HPI Here due to RUQ abdominal pain This visit occurred during the SARS-CoV-2 public health emergency.  Safety protocols were in place, including screening questions prior to the visit, additional usage of staff PPE, and extensive cleaning of exam room while observing appropriate contact time as indicated for disinfecting solutions.   Started with pain 7/3 Ordered pizza and felt terrible pain after eating it Seemed gassy and sharp In RUQ  Was worried about his appendix They gave maalox-----and was in ER Improved so didn't wait to be seen  Still very tender in that area He feels a bulge under the rib also  Will get some stomach symptoms---flares with spicy food That is always epigastric Hasn't needed any meds for this  No current outpatient medications on file prior to visit.   No current facility-administered medications on file prior to visit.    No Known Allergies  Past Medical History:  Diagnosis Date  . Anxiety disorder   . GERD (gastroesophageal reflux disease)   . Migraine syndrome     History reviewed. No pertinent surgical history.  Family History  Problem Relation Age of Onset  . COPD Father   . Alcohol abuse Father   . Heart disease Father   . Depression Father   . Diabetes Maternal Uncle   . Peptic Ulcer Maternal Grandmother   . Peptic Ulcer Maternal Grandfather   . Heart disease Other     Social History   Socioeconomic History  . Marital status: Married    Spouse name: Not on file  . Number of children: 5  . Years of education: Not on file  . Highest education level: Not on file  Occupational History  . 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 Occupation: Carpentry on the side  Tobacco Use  . Smoking status: Current Every Day Smoker  . Smokeless tobacco: Never Used  Substance and Sexual Activity  . Alcohol use: No  . Drug use: Not on  file  . Sexual activity: Not on file  Other Topics Concern  . Not on file  Social History Narrative   Married   3 children and 1 on the way   Social Determinants of Health   Financial Resource Strain:   . Difficulty of Paying Living Expenses:   Food Insecurity:   . Worried About Programme researcher, broadcasting/film/video in the Last Year:   . Barista in the Last Year:   Transportation Needs:   . Freight forwarder (Medical):   Marland Kitchen Lack of Transportation (Non-Medical):   Physical Activity:   . Days of Exercise per Week:   . Minutes of Exercise per Session:   Stress:   . Feeling of Stress :   Social Connections:   . Frequency of Communication with Friends and Family:   . Frequency of Social Gatherings with Friends and Family:   . Attends Religious Services:   . Active Member of Clubs or Organizations:   . Attends Banker Meetings:   Marland Kitchen Marital Status:   Intimate Partner Violence:   . Fear of Current or Ex-Partner:   . Emotionally Abused:   Marland Kitchen Physically Abused:   . Sexually Abused:    Review of Systems No fever No N/V More alcohol lately---has cut back now Appetite is okay Weight stable    Objective:   Physical Exam  Constitutional:      Appearance: Normal appearance.  Cardiovascular:     Rate and Rhythm: Normal rate and regular rhythm.     Heart sounds: No murmur heard.  No gallop.   Pulmonary:     Effort: Pulmonary effort is normal.     Breath sounds: Normal breath sounds. No wheezing or rales.  Abdominal:     General: Bowel sounds are normal. There is no distension.     Palpations: Abdomen is soft. There is no mass.     Hernia: No hernia is present.     Comments: Marked tenderness over RUQ Unable to even check Murphy's sign  Musculoskeletal:     Cervical back: Neck supple.     Right lower leg: No edema.     Left lower leg: No edema.  Lymphadenopathy:     Cervical: No cervical adenopathy.  Neurological:     Mental Status: He is alert.  Psychiatric:          Mood and Affect: Mood normal.        Behavior: Behavior normal.            Assessment & Plan:

## 2020-01-17 NOTE — Assessment & Plan Note (Signed)
Severe tenderness suggestive of active gallbladder disease Young for stone---so need to consider alternatives like atypical appendiceal disease Has been in pain for 6 days! No fever though Need to treat for cholecystitis--will give augmentin Different than his gastric pain---but will have him restart famotidine Ultrasound ASAP [place] for surgery if needed

## 2020-01-17 NOTE — Patient Instructions (Signed)
Please start famotidine 20mg  twice a day

## 2020-01-17 NOTE — Telephone Encounter (Signed)
Spoke to patient No gallbladder disease and normal WBC makes appendicitis very unlikely Doubt the GERD but he will take the pepcid Please take the augmentin just in case ER if severe change If ongoing pain on Monday---will proceed with CT abd/pelvis

## 2020-01-17 NOTE — Telephone Encounter (Signed)
I received a call report from Sharon regarding patient's abdominal US.     IMPRESSION: 1. No significant sonographic abnormality is identified. Please note that the appendix was not imaged on today's exam  Victorino Dike states that possible appendix disease was put in the order note, but the appendix is not included in an abdominal US. If Dr. Alphonsus Sias needs to look at appendix, he would need to order a CT scan.   She states that patient was in a lot of pain/distress during the [place], and his right upper abdomen was very tender on exam.    Dr. Alphonsus Sias, please advise.

## 2020-04-13 ENCOUNTER — Telehealth: Payer: Self-pay | Admitting: Internal Medicine

## 2020-04-13 NOTE — Telephone Encounter (Signed)
Patient would like a c/b to help understand his labs.  Thank you!

## 2020-04-13 NOTE — Telephone Encounter (Signed)
Spoke to pt. Was asking about Liver Functions. Advised those were normal at last check. He has made an OV.

## 2020-04-28 ENCOUNTER — Ambulatory Visit: Payer: Medicaid Other | Admitting: Internal Medicine

## 2020-06-26 ENCOUNTER — Ambulatory Visit
Admission: EM | Admit: 2020-06-26 | Discharge: 2020-06-26 | Disposition: A | Discharge status: Home / Self Care | Payer: BC Managed Care – PPO | Attending: Family Medicine | Admitting: Family Medicine

## 2020-06-26 ENCOUNTER — Encounter: Payer: Self-pay | Admitting: Emergency Medicine

## 2020-06-26 ENCOUNTER — Other Ambulatory Visit: Payer: Self-pay

## 2020-06-26 DIAGNOSIS — F17210 Nicotine dependence, cigarettes, uncomplicated: Secondary | ICD-10-CM | POA: Insufficient documentation

## 2020-06-26 DIAGNOSIS — R059 Cough, unspecified: Secondary | ICD-10-CM | POA: Insufficient documentation

## 2020-06-26 DIAGNOSIS — Z20822 Contact with and (suspected) exposure to covid-19: Secondary | ICD-10-CM | POA: Insufficient documentation

## 2020-06-26 DIAGNOSIS — J069 Acute upper respiratory infection, unspecified: Secondary | ICD-10-CM | POA: Insufficient documentation

## 2020-06-26 LAB — RESP PANEL BY RT-PCR (FLU A&B, COVID) ARPGX2
Influenza A by PCR: NEGATIVE
Influenza B by PCR: NEGATIVE
SARS Coronavirus 2 by RT PCR: NEGATIVE

## 2020-06-26 MED ORDER — IPRATROPIUM BROMIDE 0.06 % NA SOLN: 2.0000 | Freq: Four times a day (QID) | NASAL | 0 refills | Status: DC | PRN

## 2020-06-26 NOTE — ED Provider Notes (Signed)
[facility]    CSN: [id number] Arrival date & time: 06/26/20  1508  History   Chief Complaint Chief Complaint  Patient presents with  . Cough  . Nasal Congestion   HPI  29 year old male presents with the above complaints.  Patient reports that his symptoms started yesterday.  He reports cough, sneezing, runny nose.  No fever.  He has had sick contacts at home.  They have all tested negative for Covid.  Patient is need Covid testing today.  No relieving factors.  No other associated symptoms.  No other complaints.  Past Medical History:  Diagnosis Date  . Anxiety disorder   . GERD (gastroesophageal reflux disease)   . Migraine syndrome     Patient Active Problem List   Diagnosis Date Noted  . RUQ abdominal tenderness 01/17/2020  . Abdominal pain 08/29/2017  . Fatigue 08/29/2017  . Anxiety disorder   . GERD (gastroesophageal reflux disease)   . Migraine syndrome    Home Medications    Prior to Admission medications   Medication Sig Start Date End Date Taking? Authorizing Provider  ipratropium (ATROVENT) 0.06 % nasal spray Place 2 sprays into both nostrils 4 (four) times daily as needed for rhinitis. 06/26/20   Tommie Sams, DO    Family History Family History  Problem Relation Age of Onset  . COPD Father   . Alcohol abuse Father   . Heart disease Father   . Depression Father   . Diabetes Maternal Uncle   . Peptic Ulcer Maternal Grandmother   . Peptic Ulcer Maternal Grandfather   . Heart disease Other     Social History Social History   Tobacco Use  . Smoking status: Current Every Day Smoker    Types: Cigarettes  . Smokeless tobacco: Never Used  Vaping Use  . Vaping Use: Never used  Substance Use Topics  . Alcohol use: No     Allergies   Patient has no known allergies.   Review of Systems Review of Systems  Constitutional: Negative for fever.  HENT: Positive for rhinorrhea and sneezing.   Respiratory: Positive for cough.     Physical Exam Triage Vital Signs ED Triage Vitals  Enc Vitals Group     BP 06/26/20 1520 124/80     Pulse Rate 06/26/20 1520 69     Resp 06/26/20 1520 16     Temp 06/26/20 1520 98.1 F (36.7 C)     Temp Source 06/26/20 1520 Oral     SpO2 06/26/20 1520 98 %     Weight 06/26/20 1517 170 lb (77.1 kg)     Height 06/26/20 1517 5\' 10"  (1.778 m)     Head Circumference --      Peak Flow --      Pain Score 06/26/20 1517 0     Pain Loc --      Pain Edu? --      Excl. in GC? --    Updated Vital Signs BP 124/80 (BP Location: Left Arm)   Pulse 69   Temp 98.1 F (36.7 C) (Oral)   Resp 16   Ht 5\' 10"  (1.778 m)   Wt 77.1 kg   SpO2 98%   BMI 24.39 kg/m   Visual Acuity Right Eye Distance:   Left Eye Distance:   Bilateral Distance:    Right Eye Near:   Left Eye Near:    Bilateral Near:     Physical Exam Constitutional:      General: He  is not in acute distress.    Appearance: Normal appearance. He is not ill-appearing.  HENT:     Head: Normocephalic and atraumatic.  Eyes:     General:        Right eye: No discharge.        Left eye: No discharge.     Conjunctiva/sclera: Conjunctivae normal.  Cardiovascular:     Rate and Rhythm: Normal rate and regular rhythm.     Heart sounds: No murmur heard.   Pulmonary:     Effort: Pulmonary effort is normal.     Breath sounds: Normal breath sounds. No wheezing, rhonchi or rales.  Neurological:     Mental Status: He is alert.  Psychiatric:        Mood and Affect: Mood normal.        Behavior: Behavior normal.    UC Treatments / Results  Labs (all labs ordered are listed, but only abnormal results are displayed) Labs Reviewed  RESP PANEL BY RT-PCR (FLU A&B, COVID) ARPGX2    EKG   Radiology No results found.  Procedures Procedures (including critical care time)  Medications Ordered in UC Medications - No data to display  Initial Impression / Assessment and Plan / UC Course  I have reviewed the triage vital  signs and the nursing notes.  Pertinent labs & imaging results that were available during my care of the patient were reviewed by me and considered in my medical decision making (see chart for details).     29 year old male presents with a viral URI.  Covid and flu testing negative today.  Treating with Atrovent nasal spray.  Supportive care.  Final Clinical Impressions(s) / UC Diagnoses   Final diagnoses:  Viral upper respiratory tract infection   Discharge Instructions   None    ED Prescriptions    Medication Sig Dispense Auth. Provider   ipratropium (ATROVENT) 0.06 % nasal spray Place 2 sprays into both nostrils 4 (four) times daily as needed for rhinitis. 15 mL Tommie Sams, DO     PDMP not reviewed this encounter.   Tommie Sams, [place] 06/26/20 1802

## 2020-06-26 NOTE — ED Triage Notes (Signed)
Patient c/o cough, sneezing, runny nose that started yesterday.  Patient denies fevers.

## 2020-08-05 ENCOUNTER — Ambulatory Visit
Admission: EM | Admit: 2020-08-05 | Discharge: 2020-08-05 | Disposition: A | Discharge status: Home / Self Care | Payer: BC Managed Care – PPO

## 2020-08-05 ENCOUNTER — Encounter: Payer: Self-pay | Admitting: Emergency Medicine

## 2020-08-05 ENCOUNTER — Ambulatory Visit: Payer: Self-pay

## 2020-08-05 ENCOUNTER — Telehealth: Payer: Self-pay | Admitting: *Deleted

## 2020-08-05 ENCOUNTER — Other Ambulatory Visit: Payer: Self-pay

## 2020-08-05 DIAGNOSIS — Z8616 Personal history of COVID-19: Secondary | ICD-10-CM

## 2020-08-05 DIAGNOSIS — R059 Cough, unspecified: Secondary | ICD-10-CM | POA: Diagnosis not present

## 2020-08-05 NOTE — Discharge Instructions (Signed)
Can go back to work tomorrow but you have to wear a mask strictly for the next 5 days.  COVID-19 INFECTION: The incubation period of COVID-19 is approximately 14 days after exposure, with most symptoms developing in roughly 4-5 days. Symptoms may range in severity from mild to critically severe. Roughly 80% of those infected will have mild symptoms. People of any age may become infected with COVID-19 and have the ability to transmit the virus. The most common symptoms include: fever, fatigue, cough, body aches, headaches, sore throat, nasal congestion, shortness of breath, nausea, vomiting, diarrhea, changes in smell and/or taste.    COURSE OF ILLNESS Some patients may begin with mild disease which can progress quickly into critical symptoms. If your symptoms are worsening please call ahead to the Emergency Department and proceed there for further treatment. Recovery time appears to be roughly 1-2 weeks for mild symptoms and 3-6 weeks for severe disease.   GO IMMEDIATELY TO ER FOR FEVER YOU ARE UNABLE TO GET DOWN WITH TYLENOL, BREATHING PROBLEMS, CHEST PAIN, FATIGUE, LETHARGY, INABILITY TO EAT OR DRINK, ETC  QUARANTINE AND ISOLATION: To help decrease the spread of COVID-19 please remain isolated if you have COVID infection or are highly suspected to have COVID infection. This means -stay home and isolate to one room in the home if you live with others. Do not share a bed or bathroom with others while ill, sanitize and wipe down all countertops and keep common areas clean and disinfected. Stay home for 5 days. If you have no symptoms or your symptoms are resolving after 5 days, you can leave your house. Continue to wear a mask around others for 5 additional days. If you have been in close contact (within 6 feet) of someone diagnosed with COVID 19, you are advised to quarantine in your home for 14 days as symptoms can develop anywhere from 2-14 days after exposure to the virus. If you develop symptoms, you   must isolate.  Most current guidelines for COVID after exposure -unvaccinated: isolate 5 days and strict mask use x 5 days. Test on day 5 is possible -vaccinated: wear mask x 10 days if symptoms do not develop -You do not necessarily need to be tested for COVID if you have + exposure and  develop symptoms. Just isolate at home x10 days from symptom onset During this global pandemic, CDC advises to practice social distancing, try to stay at least 29ft away from others at all times. Wear a face covering. Wash and sanitize your hands regularly and avoid going anywhere that is not necessary.  KEEP IN MIND THAT THE COVID TEST IS NOT 100% ACCURATE AND YOU SHOULD STILL DO EVERYTHING TO PREVENT POTENTIAL SPREAD OF VIRUS TO OTHERS (WEAR MASK, WEAR GLOVES, WASH HANDS AND SANITIZE REGULARLY). IF INITIAL TEST IS NEGATIVE, THIS MAY NOT MEAN YOU ARE DEFINITELY NEGATIVE. MOST ACCURATE TESTING IS DONE 5-7 DAYS AFTER EXPOSURE.   It is not advised by CDC to get re-tested after receiving a positive COVID test since you can still test positive for weeks to months after you have already cleared the virus.   *If you have not been vaccinated for COVID, I strongly suggest you consider getting vaccinated as long as there are no contraindications.

## 2020-08-05 NOTE — ED Triage Notes (Signed)
Pt states he tested positive on 07/30/20. He states he is having nasal congestion and runny nose but he need a note to return to work.

## 2020-08-05 NOTE — Telephone Encounter (Signed)
Patient left a voicemail stating that he tested positive for covid and was due to get out of quarantine on 08/04/20. Patient stated that his employer is requiring a work note to return to work. Patient stated that he was hoping that his doctor would give him a note. Left a message for patient to call back to get more information. It appears that patient is at an UC now.

## 2020-08-05 NOTE — ED Provider Notes (Signed)
[facility]    CSN: [id number] Arrival date & time: 08/05/20  1535      History   Chief Complaint Chief Complaint  Patient presents with  . work note    HPI Max Wright is a 30 y.o. male presenting for a work note.  Patient states that he does a positive for COVID-19 on 07/30/2020.  He says that his symptoms started the day before that.  Patient was tested through the health department at a free testing site.  He does have his results on his phone today.  He states that he has had some continued fatigue, minor lightheadedness and a slight cough but overall feels much better.  Denies having any fever in greater than 24 hours.  Patient states that he is ready to return to work.  Not taking any medication for symptoms.  No other concerns.  HPI  Past Medical History:  Diagnosis Date  . Anxiety disorder   . GERD (gastroesophageal reflux disease)   . Migraine syndrome     Patient Active Problem List   Diagnosis Date Noted  . RUQ abdominal tenderness 01/17/2020  . Abdominal pain 08/29/2017  . Fatigue 08/29/2017  . Anxiety disorder   . GERD (gastroesophageal reflux disease)   . Migraine syndrome     History reviewed. No pertinent surgical history.     Home Medications    Prior to Admission medications   Medication Sig Start Date End Date Taking? Authorizing Provider  ipratropium (ATROVENT) 0.06 % nasal spray Place 2 sprays into both nostrils 4 (four) times daily as needed for rhinitis. 06/26/20   Tommie Sams, DO    Family History Family History  Problem Relation Age of Onset  . COPD Father   . Alcohol abuse Father   . Heart disease Father   . Depression Father   . Diabetes Maternal Uncle   . Peptic Ulcer Maternal Grandmother   . Peptic Ulcer Maternal Grandfather   . Heart disease Other     Social History Social History   Tobacco Use  . Smoking status: Current Every Day Smoker    Types: Cigarettes  . Smokeless tobacco: Never Used  Vaping Use   . Vaping Use: Never used  Substance Use Topics  . Alcohol use: No     Allergies   Patient has no known allergies.   Review of Systems Review of Systems  Constitutional: Positive for fatigue. Negative for fever.  HENT: Negative for congestion, rhinorrhea, sinus pressure, sinus pain and sore throat.   Respiratory: Positive for cough. Negative for shortness of breath.   Gastrointestinal: Negative for abdominal pain, diarrhea, nausea and vomiting.  Musculoskeletal: Negative for myalgias.  Neurological: Positive for light-headedness. Negative for weakness and headaches.  Hematological: Negative for adenopathy.     Physical Exam Triage Vital Signs ED Triage Vitals  Enc Vitals Group     BP 08/05/20 1611 (!) 143/73     Pulse Rate 08/05/20 1611 65     Resp 08/05/20 1611 18     Temp 08/05/20 1611 98.7 F (37.1 C)     Temp Source 08/05/20 1611 Oral     SpO2 08/05/20 1611 100 %     Weight 08/05/20 1610 169 lb 15.6 oz (77.1 kg)     Height 08/05/20 1610 5\' 10"  (1.778 m)     Head Circumference --      Peak Flow --      Pain Score 08/05/20 1610 0     Pain  Loc --      Pain Edu? --      Excl. in GC? --    No data found.  Updated Vital Signs BP 134/79 (BP Location: Right Arm)   Pulse 65   Temp 98.7 F (37.1 C) (Oral)   Resp 18   Ht 5\' 10"  (1.778 m)   Wt 169 lb 15.6 oz (77.1 kg)   SpO2 100%   BMI 24.39 kg/m       Physical Exam Vitals and nursing note reviewed.  Constitutional:      General: He is not in acute distress.    Appearance: Normal appearance. He is well-developed and well-nourished. He is not ill-appearing.  HENT:     Head: Normocephalic and atraumatic.     Nose: Nose normal.     Mouth/Throat:     Mouth: Mucous membranes are moist.     Pharynx: Oropharynx is clear.  Eyes:     General: No scleral icterus.    Conjunctiva/sclera: Conjunctivae normal.  Cardiovascular:     Rate and Rhythm: Normal rate and regular rhythm.     Heart sounds: Normal heart  sounds.  Pulmonary:     Effort: Pulmonary effort is normal. No respiratory distress.     Breath sounds: Normal breath sounds.  Musculoskeletal:        General: No edema.     Cervical back: Neck supple.  Skin:    General: Skin is warm and dry.  Neurological:     General: No focal deficit present.     Mental Status: He is alert. Mental status is at baseline.     Motor: No weakness.     Gait: Gait normal.  Psychiatric:        Mood and Affect: Mood and affect and mood normal.        Behavior: Behavior normal.        Thought Content: Thought content normal.      UC Treatments / Results  Labs (all labs ordered are listed, but only abnormal results are displayed) Labs Reviewed - No data to display  EKG   Radiology No results found.  Procedures Procedures (including critical care time)  Medications Ordered in UC Medications - No data to display  Initial Impression / Assessment and Plan / UC Course  I have reviewed the triage vital signs and the nursing notes.  Pertinent labs & imaging results that were available during my care of the patient were reviewed by me and considered in my medical decision making (see chart for details).   I was able to confirm patient's positive COVID-19 test result on 07/30/2020.  All of his vital signs are normal and stable in the clinic and he is well-appearing.  Benign exam.  Return to work note given in accordance with CDC guidelines.  Advised him to follow-up with our department if he starts to have a fever again or any worsening symptoms.  Patient agreeable.   Final Clinical Impressions(s) / UC Diagnoses   Final diagnoses:  Personal history of COVID-19  Cough     Discharge Instructions     Can go back to work tomorrow but you have to wear a mask strictly for the next 5 days.  COVID-19 INFECTION: The incubation period of COVID-19 is approximately 14 days after exposure, with most symptoms developing in roughly 4-5 days. Symptoms may  range in severity from mild to critically severe. Roughly 80% of those infected will have mild symptoms. People of any age 24  become infected with COVID-19 and have the ability to transmit the virus. The most common symptoms include: fever, fatigue, cough, body aches, headaches, sore throat, nasal congestion, shortness of breath, nausea, vomiting, diarrhea, changes in smell and/or taste.    COURSE OF ILLNESS Some patients may begin with mild disease which can progress quickly into critical symptoms. If your symptoms are worsening please call ahead to the Emergency Department and proceed there for further treatment. Recovery time appears to be roughly 1-2 weeks for mild symptoms and 3-6 weeks for severe disease.   GO IMMEDIATELY TO ER FOR FEVER YOU ARE UNABLE TO GET DOWN WITH TYLENOL, BREATHING PROBLEMS, CHEST PAIN, FATIGUE, LETHARGY, INABILITY TO EAT OR DRINK, ETC  QUARANTINE AND ISOLATION: To help decrease the spread of COVID-19 please remain isolated if you have COVID infection or are highly suspected to have COVID infection. This means -stay home and isolate to one room in the home if you live with others. Do not share a bed or bathroom with others while ill, sanitize and wipe down all countertops and keep common areas clean and disinfected. Stay home for 5 days. If you have no symptoms or your symptoms are resolving after 5 days, you can leave your house. Continue to wear a mask around others for 5 additional days. If you have been in close contact (within 6 feet) of someone diagnosed with COVID 19, you are advised to quarantine in your home for 14 days as symptoms can develop anywhere from 2-14 days after exposure to the virus. If you develop symptoms, you  must isolate.  Most current guidelines for COVID after exposure -unvaccinated: isolate 5 days and strict mask use x 5 days. Test on day 5 is possible -vaccinated: wear mask x 10 days if symptoms do not develop -You do not necessarily need to be  tested for COVID if you have + exposure and  develop symptoms. Just isolate at home x10 days from symptom onset During this global pandemic, CDC advises to practice social distancing, try to stay at least 47ft away from others at all times. Wear a face covering. Wash and sanitize your hands regularly and avoid going anywhere that is not necessary.  KEEP IN MIND THAT THE COVID TEST IS NOT 100% ACCURATE AND YOU SHOULD STILL DO EVERYTHING TO PREVENT POTENTIAL SPREAD OF VIRUS TO OTHERS (WEAR MASK, WEAR GLOVES, WASH HANDS AND SANITIZE REGULARLY). IF INITIAL TEST IS NEGATIVE, THIS MAY NOT MEAN YOU ARE DEFINITELY NEGATIVE. MOST ACCURATE TESTING IS DONE 5-7 DAYS AFTER EXPOSURE.   It is not advised by CDC to get re-tested after receiving a positive COVID test since you can still test positive for weeks to months after you have already cleared the virus.   *If you have not been vaccinated for COVID, I strongly suggest you consider getting vaccinated as long as there are no contraindications.      ED Prescriptions    None     PDMP not reviewed this encounter.   Shirlee Latch, PA-C 08/05/20 1636

## 2021-01-07 ENCOUNTER — Ambulatory Visit
Admission: RE | Admit: 2021-01-07 | Discharge: 2021-01-07 | Disposition: A | Discharge status: Home / Self Care | Payer: BC Managed Care – PPO | Source: Ambulatory Visit | Attending: Emergency Medicine | Admitting: Emergency Medicine

## 2021-01-07 ENCOUNTER — Other Ambulatory Visit: Payer: Self-pay

## 2021-01-07 VITALS — BP 138/75 | HR 55 | Temp 98.6°F | Resp 18

## 2021-01-07 DIAGNOSIS — J0100 Acute maxillary sinusitis, unspecified: Secondary | ICD-10-CM

## 2021-01-07 DIAGNOSIS — H6692 Otitis media, unspecified, left ear: Secondary | ICD-10-CM | POA: Diagnosis not present

## 2021-01-07 MED ORDER — AMOXICILLIN 875 MG PO TABS: 875.0000 mg | ORAL_TABLET | Freq: Two times a day (BID) | ORAL | 0 refills | Status: AC

## 2021-01-07 NOTE — ED Triage Notes (Signed)
Pt presents today with c/o of nasal congestion/drainage with cough x 4 days. Denies fever.

## 2021-01-07 NOTE — Discharge Instructions (Addendum)
Take the amoxicillin as directed.  Follow up with your primary care provider if your symptoms are not improving.   ° ° °

## 2021-01-07 NOTE — ED Provider Notes (Signed)
Max Wright    CSN: [id number] Arrival date & time: 01/07/21  1344      History   Chief Complaint Chief Complaint  Patient presents with   Nasal Congestion   Cough    HPI Max Wright is a 30 y.o. male.  Patient presents with ongoing sinus congestion and runny nose x2 months; worse over the past 4 days.  He has a mild nonproductive cough.  He denies fever, chills, rash, shortness of breath, or other symptoms.  Treatments attempted at home with allergy medication.  His medical history includes migraine syndrome, GERD, anxiety.  The history is provided by the patient and medical records.   Past Medical History:  Diagnosis Date   Anxiety disorder    GERD (gastroesophageal reflux disease)    Migraine syndrome     Patient Active Problem List   Diagnosis Date Noted   RUQ abdominal tenderness 01/17/2020   Abdominal pain 08/29/2017   Fatigue 08/29/2017   Anxiety disorder    GERD (gastroesophageal reflux disease)    Migraine syndrome     History reviewed. No pertinent surgical history.     Home Medications    Prior to Admission medications   Medication Sig Start Date End Date Taking? Authorizing Provider  amoxicillin (AMOXIL) 875 MG tablet Take 1 tablet (875 mg total) by mouth 2 (two) times daily for 7 days. 01/07/21 01/14/21 Yes Mickie Bail, NP  ipratropium (ATROVENT) 0.06 % nasal spray Place 2 sprays into both nostrils 4 (four) times daily as needed for rhinitis. 06/26/20   Tommie Sams, DO    Family History Family History  Problem Relation Age of Onset   COPD Father    Alcohol abuse Father    Heart disease Father    Depression Father    Diabetes Maternal Uncle    Peptic Ulcer Maternal Grandmother    Peptic Ulcer Maternal Grandfather    Heart disease Other     Social History Social History   Tobacco Use   Smoking status: Every Day    Pack years: 0.00    Types: Cigarettes   Smokeless tobacco: Never  Vaping Use   Vaping Use: Never used   Substance Use Topics   Alcohol use: No   Drug use: Never     Allergies   Patient has no known allergies.   Review of Systems Review of Systems  Constitutional:  Negative for chills and fever.  HENT:  Positive for congestion, postnasal drip, rhinorrhea and sinus pressure. Negative for ear pain and sore throat.   Respiratory:  Positive for cough. Negative for shortness of breath.   Cardiovascular:  Negative for chest pain and palpitations.  Gastrointestinal:  Negative for abdominal pain and vomiting.  Skin:  Negative for color change and rash.  All other systems reviewed and are negative.   Physical Exam Triage Vital Signs ED Triage Vitals  Enc Vitals Group     BP 01/07/21 1356 138/75     Pulse Rate 01/07/21 1356 (!) 55     Resp 01/07/21 1356 18     Temp 01/07/21 1356 98.6 F (37 C)     Temp Source 01/07/21 1356 Oral     SpO2 01/07/21 1356 96 %     Weight --      Height --      Head Circumference --      Peak Flow --      Pain Score 01/07/21 1354 2     Pain Loc --  Pain Edu? --      Excl. in GC? --    No data found.  Updated Vital Signs BP 138/75 (BP Location: Right Arm)   Pulse (!) 55   Temp 98.6 F (37 C) (Oral)   Resp 18   SpO2 96%   Visual Acuity Right Eye Distance:   Left Eye Distance:   Bilateral Distance:    Right Eye Near:   Left Eye Near:    Bilateral Near:     Physical Exam Vitals and nursing note reviewed.  Constitutional:      General: He is not in acute distress.    Appearance: He is well-developed.  HENT:     Head: Normocephalic and atraumatic.     Right Ear: Tympanic membrane and ear canal normal. Tympanic membrane is not erythematous.     Left Ear: Ear canal normal. Tympanic membrane is erythematous.     Nose: Congestion and rhinorrhea present.     Mouth/Throat:     Mouth: Mucous membranes are moist.     Pharynx: Posterior oropharyngeal erythema present.  Eyes:     Conjunctiva/sclera: Conjunctivae normal.  Cardiovascular:      Rate and Rhythm: Normal rate and regular rhythm.     Heart sounds: Normal heart sounds.  Pulmonary:     Effort: Pulmonary effort is normal. No respiratory distress.     Breath sounds: Normal breath sounds.  Abdominal:     Palpations: Abdomen is soft.     Tenderness: There is no abdominal tenderness.  Musculoskeletal:     Cervical back: Neck supple.  Skin:    General: Skin is warm and dry.  Neurological:     General: No focal deficit present.     Mental Status: He is alert and oriented to person, place, and time.     Gait: Gait normal.  Psychiatric:        Mood and Affect: Mood normal.        Behavior: Behavior normal.     UC Treatments / Results  Labs (all labs ordered are listed, but only abnormal results are displayed) Labs Reviewed - No data to display  EKG   Radiology No results found.  Procedures Procedures (including critical care time)  Medications Ordered in UC Medications - No data to display  Initial Impression / Assessment and Plan / UC Course  I have reviewed the triage vital signs and the nursing notes.  Pertinent labs & imaging results that were available during my care of the patient were reviewed by me and considered in my medical decision making (see chart for details).  Acute sinusitis, left otitis media.  Treating with amoxicillin.  Discussed other symptomatic treatment including plain over-the-counter Mucinex and ibuprofen.  Patient declines COVID test today.  Instructed him to follow-up with his PCP if his symptoms are not improving.  He agrees to plan of care.   Final Clinical Impressions(s) / UC Diagnoses   Final diagnoses:  Acute non-recurrent maxillary sinusitis  Left otitis media, unspecified otitis media type     Discharge Instructions      Take the amoxicillin as directed.    Follow up with your primary care provider if your symptoms are not improving.         ED Prescriptions     Medication Sig Dispense Auth.  Provider   amoxicillin (AMOXIL) 875 MG tablet Take 1 tablet (875 mg total) by mouth 2 (two) times daily for 7 days. 14 tablet Mickie Bail, NP  PDMP not reviewed this encounter.   Mickie Bail, NP 01/07/21 [phone]

## 2021-04-24 ENCOUNTER — Other Ambulatory Visit: Payer: Self-pay

## 2021-04-24 ENCOUNTER — Encounter: Payer: Self-pay | Admitting: Emergency Medicine

## 2021-04-24 ENCOUNTER — Ambulatory Visit
Admission: EM | Admit: 2021-04-24 | Discharge: 2021-04-24 | Disposition: A | Discharge status: Home / Self Care | Payer: BC Managed Care – PPO | Attending: Internal Medicine | Admitting: Internal Medicine

## 2021-04-24 DIAGNOSIS — L237 Allergic contact dermatitis due to plants, except food: Secondary | ICD-10-CM

## 2021-04-24 MED ORDER — METHYLPREDNISOLONE 4 MG PO TBPK: ORAL_TABLET | ORAL | 0 refills | Status: DC

## 2021-04-24 NOTE — ED Provider Notes (Signed)
[name]    CSN: [id number] Arrival date & time: 04/24/21  0948      History   Chief Complaint Chief Complaint  Patient presents with   Poison Ivy    HPI Max Wright is a 30 y.o. male who presents with poison IV rahs on hands and he was moving a fence at home 5 days ago. The rash started on R middl Finger 2 days later, then since then is still spreading on his dorsal hand, face and neck. Has had this before when he lived in [place], but with topical creams     Past Medical History:  Diagnosis Date   Anxiety disorder    GERD (gastroesophageal reflux disease)    Migraine syndrome     Patient Active Problem List   Diagnosis Date Noted   RUQ abdominal tenderness 01/17/2020   Abdominal pain 08/29/2017   Fatigue 08/29/2017   Anxiety disorder    GERD (gastroesophageal reflux disease)    Migraine syndrome     History reviewed. No pertinent surgical history.     Home Medications    Prior to Admission medications   Medication Sig Start Date End Date Taking? Authorizing Provider  ipratropium (ATROVENT) 0.06 % nasal spray Place 2 sprays into both nostrils 4 (four) times daily as needed for rhinitis. 06/26/20   Tommie Sams, DO    Family History Family History  Problem Relation Age of Onset   COPD Father    Alcohol abuse Father    Heart disease Father    Depression Father    Diabetes Maternal Uncle    Peptic Ulcer Maternal Grandmother    Peptic Ulcer Maternal Grandfather    Heart disease Other     Social History Social History   Tobacco Use   Smoking status: Every Day    Types: Cigarettes   Smokeless tobacco: Never  Vaping Use   Vaping Use: Never used  Substance Use Topics   Alcohol use: No   Drug use: Never     Allergies   Patient has no known allergies.   Review of Systems Review of Systems + itchy rash, the rest is neg   Physical Exam Triage Vital Signs ED Triage Vitals [04/24/21 1019]  Enc Vitals Group     BP 131/78      Pulse Rate (!) 57     Resp 15     Temp 98.2 F (36.8 C)     Temp Source Oral     SpO2 97 %     Weight      Height      Head Circumference      Peak Flow      Pain Score 0     Pain Loc      Pain Edu?      Excl. in GC?    No data found.  Updated Vital Signs BP 131/78 (BP Location: Right Arm)   Pulse (!) 57   Temp 98.2 F (36.8 C) (Oral)   Resp 15   SpO2 97%   Visual Acuity Right Eye Distance:   Left Eye Distance:   Bilateral Distance:    Right Eye Near:   Left Eye Near:    Bilateral Near:     Physical Exam Vitals and nursing note reviewed.  Constitutional:      Appearance: Normal appearance.  HENT:     Right Ear: External ear normal.     Left Ear: External ear normal.  Eyes:  General: No scleral icterus.    Conjunctiva/sclera: Conjunctivae normal.  Pulmonary:     Effort: Pulmonary effort is normal.  Musculoskeletal:        General: Normal range of motion.     Cervical back: Neck supple.  Skin:    General: Skin is warm and dry.     Findings: Rash present.     Comments: Vesicular Rash present on both dorsal hands, with clear oozing and crusting, has one small papule on R eye corner and a linear one on L upper chest   Neurological:     Mental Status: He is alert and oriented to person, place, and time.     Gait: Gait normal.  Psychiatric:        Mood and Affect: Mood normal.        Behavior: Behavior normal.        Thought Content: Thought content normal.        Judgment: Judgment normal.     UC Treatments / Results  Labs (all labs ordered are listed, but only abnormal results are displayed) Labs Reviewed - No data to display  EKG   Radiology No results found.  Procedures Procedures (including critical care time)  Medications Ordered in UC Medications - No data to display  Initial Impression / Assessment and Plan / UC Course  I have reviewed the triage vital signs and the nursing notes. Poison IV I placed him on Medrol dose pack. See  instructions.      Final Clinical Impressions(s) / UC Diagnoses   Final diagnoses:  None   Discharge Instructions   None    ED Prescriptions   None    PDMP not reviewed this encounter.   Garey Ham, PA-C 04/24/21 1627

## 2021-04-24 NOTE — ED Triage Notes (Signed)
Patient c/o poison ivy x 2 days.   Patient endorses rash is present on upper extremities and face.   Patient endorses an area that is close to his RT eye.   Patient has used calamine lotion and benadryl with no relief of symptoms.

## 2021-04-24 NOTE — Discharge Instructions (Addendum)
Use a wash called TECNU and make sure to wash the oil of the plant off.

## 2021-05-06 ENCOUNTER — Ambulatory Visit
Admission: EM | Admit: 2021-05-06 | Discharge: 2021-05-06 | Disposition: A | Discharge status: Home / Self Care | Payer: BC Managed Care – PPO | Attending: Emergency Medicine | Admitting: Emergency Medicine

## 2021-05-06 ENCOUNTER — Other Ambulatory Visit: Payer: Self-pay

## 2021-05-06 ENCOUNTER — Encounter: Payer: Self-pay | Admitting: Emergency Medicine

## 2021-05-06 DIAGNOSIS — L03011 Cellulitis of right finger: Secondary | ICD-10-CM | POA: Diagnosis not present

## 2021-05-06 DIAGNOSIS — L237 Allergic contact dermatitis due to plants, except food: Secondary | ICD-10-CM

## 2021-05-06 MED ORDER — METHYLPREDNISOLONE 4 MG PO TBPK: ORAL_TABLET | ORAL | 0 refills | Status: DC

## 2021-05-06 MED ORDER — SULFAMETHOXAZOLE-TRIMETHOPRIM 800-160 MG PO TABS: 1.0000 | ORAL_TABLET | Freq: Two times a day (BID) | ORAL | 0 refills | Status: AC

## 2021-05-06 NOTE — Discharge Instructions (Addendum)
Take the antibiotic and steroid as directed.    Follow-up with your primary care provider on Monday.    Return here right away if you note signs of worsening infection such as fever, increased redness, puslike drainage, or other concerns.

## 2021-05-06 NOTE — ED Provider Notes (Signed)
[name]    CSN: [id number] Arrival date & time: 05/06/21  1608      History   Chief Complaint Chief Complaint  Patient presents with   Wound Check    HPI Max Wright is a 30 y.o. male.  Patient presents with poison ivy rash on his right hand x2 weeks.  He was seen at this urgent care on 04/24/2021; diagnosed with poison ivy dermatitis; treated with Medrol Dosepak.  His rash improved and resolved on other parts of his body but has persisted on his right hand.  The rash now is appears to be getting worse and has redness in some areas.  Some clear drainage from the vesicles.  He denies fever, chills, numbness, weakness, paresthesias, or other symptoms.  The history is provided by the patient and medical records.   Past Medical History:  Diagnosis Date   Anxiety disorder    GERD (gastroesophageal reflux disease)    Migraine syndrome     Patient Active Problem List   Diagnosis Date Noted   RUQ abdominal tenderness 01/17/2020   Abdominal pain 08/29/2017   Fatigue 08/29/2017   Anxiety disorder    GERD (gastroesophageal reflux disease)    Migraine syndrome     History reviewed. No pertinent surgical history.     Home Medications    Prior to Admission medications   Medication Sig Start Date End Date Taking? Authorizing Provider  sulfamethoxazole-trimethoprim (BACTRIM DS) 800-160 MG tablet Take 1 tablet by mouth 2 (two) times daily for 7 days. 05/06/21 05/13/21 Yes Mickie Bail, NP  ipratropium (ATROVENT) 0.06 % nasal spray Place 2 sprays into both nostrils 4 (four) times daily as needed for rhinitis. 06/26/20   Tommie Sams, DO  methylPREDNISolone (MEDROL DOSEPAK) 4 MG TBPK tablet Take as directed 05/06/21   Mickie Bail, NP    Family History Family History  Problem Relation Age of Onset   COPD Father    Alcohol abuse Father    Heart disease Father    Depression Father    Diabetes Maternal Uncle    Peptic Ulcer Maternal Grandmother    Peptic  Ulcer Maternal Grandfather    Heart disease Other     Social History Social History   Tobacco Use   Smoking status: Every Day    Types: Cigarettes   Smokeless tobacco: Never  Vaping Use   Vaping Use: Never used  Substance Use Topics   Alcohol use: No   Drug use: Never     Allergies   Patient has no known allergies.   Review of Systems Review of Systems  Constitutional:  Negative for chills and fever.  Respiratory:  Negative for cough and shortness of breath.   Cardiovascular:  Negative for chest pain and palpitations.  Skin:  Positive for color change and rash.  Neurological:  Negative for weakness and numbness.  All other systems reviewed and are negative.   Physical Exam Triage Vital Signs ED Triage Vitals  Enc Vitals Group     BP      Pulse      Resp      Temp      Temp src      SpO2      Weight      Height      Head Circumference      Peak Flow      Pain Score      Pain Loc      Pain Edu?  Excl. in GC?    No data found.  Updated Vital Signs BP 131/81   Pulse (!) 59   Temp 98 F (36.7 C) (Oral)   Resp 18   SpO2 98%   Visual Acuity Right Eye Distance:   Left Eye Distance:   Bilateral Distance:    Right Eye Near:   Left Eye Near:    Bilateral Near:     Physical Exam Vitals and nursing note reviewed.  Constitutional:      General: He is not in acute distress.    Appearance: He is well-developed.  HENT:     Head: Normocephalic and atraumatic.     Mouth/Throat:     Mouth: Mucous membranes are moist.  Eyes:     Conjunctiva/sclera: Conjunctivae normal.  Cardiovascular:     Rate and Rhythm: Normal rate and regular rhythm.     Heart sounds: Normal heart sounds.  Pulmonary:     Effort: Pulmonary effort is normal. No respiratory distress.     Breath sounds: Normal breath sounds.  Abdominal:     Palpations: Abdomen is soft.     Tenderness: There is no abdominal tenderness.  Musculoskeletal:        General: Normal range of motion.      Cervical back: Neck supple.  Skin:    General: Skin is warm and dry.     Capillary Refill: Capillary refill takes less than 2 seconds.     Findings: Rash present.     Comments: Rash on right hand.  See pictures for details.  Bulla on right ring finger with clear yellow drainage when aspirated.  Neurological:     General: No focal deficit present.     Mental Status: He is alert and oriented to person, place, and time.     Sensory: No sensory deficit.     Motor: No weakness.     Gait: Gait normal.  Psychiatric:        Mood and Affect: Mood normal.        Behavior: Behavior normal.        UC Treatments / Results  Labs (all labs ordered are listed, but only abnormal results are displayed) Labs Reviewed - No data to display  EKG   Radiology No results found.  Procedures Procedures (including critical care time)  Medications Ordered in UC Medications - No data to display  Initial Impression / Assessment and Plan / UC Course  I have reviewed the triage vital signs and the nursing notes.  Pertinent labs & imaging results that were available during my care of the patient were reviewed by me and considered in my medical decision making (see chart for details).  Poison ivy dermatitis with cellulitis.  Treating with Septra DS and Medrol Dosepak.  Instructed patient to follow-up with his PCP on Monday for recheck of the area.  Instructed patient to return here right away or go to the ED if he notes signs of worsening infection.  Education provided on poison ivy dermatitis and cellulitis.  Patient agrees to plan of care.   Final Clinical Impressions(s) / UC Diagnoses   Final diagnoses:  Contact dermatitis due to poison ivy  Cellulitis of finger of right hand     Discharge Instructions      Take the antibiotic and steroid as directed.    Follow-up with your primary care provider on Monday.    Return here right away if you note signs of worsening infection such as  fever, increased  redness, puslike drainage, or other concerns.         ED Prescriptions     Medication Sig Dispense Auth. Provider   methylPREDNISolone (MEDROL DOSEPAK) 4 MG TBPK tablet Take as directed 21 tablet Mickie Bail, NP   sulfamethoxazole-trimethoprim (BACTRIM DS) 800-160 MG tablet Take 1 tablet by mouth 2 (two) times daily for 7 days. 14 tablet Mickie Bail, NP      PDMP not reviewed this encounter.   Mickie Bail, NP 05/06/21 1704

## 2021-05-06 NOTE — ED Triage Notes (Signed)
Pt here with right ring and middle finger blistering and erythema post poison ivy 2 weeks ago.

## 2021-08-11 ENCOUNTER — Ambulatory Visit: Payer: BC Managed Care – PPO | Admitting: Internal Medicine

## 2021-08-13 ENCOUNTER — Ambulatory Visit (INDEPENDENT_AMBULATORY_CARE_PROVIDER_SITE_OTHER): Payer: BC Managed Care – PPO | Admitting: Internal Medicine

## 2021-08-13 ENCOUNTER — Encounter: Payer: Self-pay | Admitting: Internal Medicine

## 2021-08-13 ENCOUNTER — Other Ambulatory Visit: Payer: Self-pay

## 2021-08-13 DIAGNOSIS — F17210 Nicotine dependence, cigarettes, uncomplicated: Secondary | ICD-10-CM

## 2021-08-13 DIAGNOSIS — F17200 Nicotine dependence, unspecified, uncomplicated: Secondary | ICD-10-CM | POA: Insufficient documentation

## 2021-08-13 DIAGNOSIS — N419 Inflammatory disease of prostate, unspecified: Secondary | ICD-10-CM

## 2021-08-13 MED ORDER — DOXYCYCLINE HYCLATE 100 MG PO TABS: 100.0000 mg | ORAL_TABLET | Freq: Two times a day (BID) | ORAL | 1 refills | Status: DC

## 2021-08-13 MED ORDER — BUPROPION HCL ER (SR) 150 MG PO TB12: 150.0000 mg | ORAL_TABLET | Freq: Two times a day (BID) | ORAL | 5 refills | Status: DC

## 2021-08-13 NOTE — Progress Notes (Signed)
° °  Subjective:    Patient ID: Max Wright, male    DOB: 03/21/1991, 31 y.o.   MRN: [id number]  HPI Here due to testicular discomfort  Started 1/29 --discomfort "at the bottom"---radiates up to pubic region Dull but sharp No lump or mass---he tried self exam  Does have sense "that I can't get all my urine out" No dysuria or hematuria  No pain with sex---had last night Monogamous with wife  No current outpatient medications on file prior to visit.   No current facility-administered medications on file prior to visit.    No Known Allergies  Past Medical History:  Diagnosis Date   Anxiety disorder    GERD (gastroesophageal reflux disease)    Migraine syndrome     History reviewed. No pertinent surgical history.  Family History  Problem Relation Age of Onset   COPD Father    Alcohol abuse Father    Heart disease Father    Depression Father    Diabetes Maternal Uncle    Peptic Ulcer Maternal Grandmother    Peptic Ulcer Maternal Grandfather    Heart disease Other     Social History   Socioeconomic History   Marital status: Married    Spouse name: Not on file   Number of children: 5   Years of education: Not on file   Highest education level: Not on file  Occupational History   Occupation: Carpentry on the side   Occupation: [place] boy: [employer]  Tobacco Use   Smoking status: Every Day    Types: Cigarettes   Smokeless tobacco: Never  Vaping Use   Vaping Use: Never used  Substance and Sexual Activity   Alcohol use: No   Drug use: Never   Sexual activity: Not on file  Other Topics Concern   Not on file  Social History Narrative   Not on file   Social Determinants of Health   Financial Resource Strain: Not on file  Food Insecurity: Not on file  Transportation Needs: Not on file  Physical Activity: Not on file  Stress: Not on file  Social Connections: Not on file  Intimate Partner Violence: Not on file   Review of Systems No  fever No N/V Appetite is not great---eats once or twice  a day     Objective:   Physical Exam Constitutional:      Appearance: Normal appearance.  Genitourinary:    Testes: Normal.     Comments: Scrotum completely normal Prostate not enlarged and no sig tenderness Neurological:     Mental Status: He is alert.           Assessment & Plan:

## 2021-08-13 NOTE — Assessment & Plan Note (Signed)
Smokes about 15 cigarettes a day Not successful with patch (still with craving) and lozenges Discussed chantix---looks like not covered by insurance  Will try bupropion 150 bid Add lozenges for craving Plan 3-6 months of treatment

## 2021-08-13 NOTE — Assessment & Plan Note (Signed)
Has mild symptoms No testicular or epididymis mass or inflammation Prostate exam benign---but symptoms most likely mild prostatitis Will try doxy 100 mg bid x 7 days Also ibuprofen 600-800 tid with meals for a few days

## 2021-08-13 NOTE — Patient Instructions (Signed)
Please start the bupropion at one a day for the first week. Then increase to twice a day and stop all cigarettes. Then absolutely no more cigarettes---but can use nicotine lozenges as needed for cravings. Let me know if you have any problems. If successful, you should continue at twice a day for at least 3 months--and maybe even 6 months (and we can consider a longer time at one a day if needed).

## 2021-11-22 ENCOUNTER — Encounter (HOSPITAL_COMMUNITY): Payer: Self-pay | Admitting: Emergency Medicine

## 2021-11-22 ENCOUNTER — Emergency Department (HOSPITAL_COMMUNITY)
Admission: EM | Admit: 2021-11-22 | Discharge: 2021-11-22 | Disposition: A | Discharge status: Home / Self Care | Payer: BC Managed Care – PPO | Attending: Emergency Medicine | Admitting: Emergency Medicine

## 2021-11-22 ENCOUNTER — Other Ambulatory Visit: Payer: Self-pay

## 2021-11-22 DIAGNOSIS — L299 Pruritus, unspecified: Secondary | ICD-10-CM | POA: Insufficient documentation

## 2021-11-22 DIAGNOSIS — L237 Allergic contact dermatitis due to plants, except food: Secondary | ICD-10-CM | POA: Insufficient documentation

## 2021-11-22 DIAGNOSIS — T7840XA Allergy, unspecified, initial encounter: Secondary | ICD-10-CM

## 2021-11-22 MED ORDER — PREDNISONE 50 MG PO TABS
60.0000 mg | ORAL_TABLET | Freq: Once | ORAL | Status: AC
  Administered 2021-11-22: 60 mg via ORAL
  Filled 2021-11-22: qty 1

## 2021-11-22 MED ORDER — DIPHENHYDRAMINE HCL 25 MG PO CAPS
25.0000 mg | ORAL_CAPSULE | Freq: Once | ORAL | Status: AC
  Administered 2021-11-22: 25 mg via ORAL
  Filled 2021-11-22: qty 1

## 2021-11-22 MED ORDER — PREDNISONE 10 MG (21) PO TBPK: ORAL_TABLET | Freq: Every day | ORAL | 0 refills | Status: DC

## 2021-11-22 NOTE — ED Triage Notes (Addendum)
Pt states he climbed a tree yesterday and started itching and noticed hives last night but that he woke up this morning itching more and has swelling to eyes bilaterally. Pt also c/o scratchy throat. Some fluid filled blisters noted to arms bilaterally as well as hives to neck, arms, trunk, and thighs. ?

## 2021-11-22 NOTE — ED Provider Notes (Signed)
?[facility] ?Provider Note ? ? ?CSN: [id number] ?Arrival date & time: 11/22/21  0453 ? ?  ? ?History ? ?Chief Complaint  ?Patient presents with  ? Allergic Reaction  ? ? ?Max Wright is a 31 y.o. male. ? ?The history is provided by the patient.  ?Allergic Reaction ?Presenting symptoms: itching, rash and swelling   ?Presenting symptoms: no difficulty breathing, no difficulty swallowing and no wheezing   ?Severity:  Moderate ?Relieved by:  Nothing ?Ineffective treatments:  None tried ?Patient presents with diffuse rash.  He reports on Saturday, May 13 ?He climbed a tree.  He reports he actually hugged a tree and climbed his way up ?The following day he noticed a rash on his arm.  Over the past several hours he has had a rash on his face and around his eyes and throughout most of his body. ?No difficulty breathing or swallowing.  No vomiting or diarrhea ?He has had poison ivy previously ? ?Home Medications ?Prior to Admission medications   ?Medication Sig Start Date End Date Taking? Authorizing Provider  ?predniSONE (STERAPRED UNI-PAK 21 TAB) 10 MG (21) TBPK tablet Take by mouth daily. Take 6 tabs by mouth daily  for 2 days, then 5 tabs for 2 days, then 4 tabs for 2 days, then 3 tabs for 2 days, 2 tabs for 2 days, then 1 tab by mouth daily for 2 days 11/22/21  Yes Ripley Fraise, MD  ?buPROPion [facility] SR) 150 MG 12 hr tablet Take 1 tablet (150 mg total) by mouth 2 (two) times daily. 08/13/21   Venia Carbon, MD  ?doxycycline (VIBRA-TABS) 100 MG tablet Take 1 tablet (100 mg total) by mouth 2 (two) times daily. 08/13/21   Venia Carbon, MD  ?   ? ?Allergies    ?Patient has no known allergies.   ? ?Review of Systems   ?Review of Systems  ?Constitutional:  Negative for fever.  ?HENT:  Negative for trouble swallowing.   ?Eyes:  Negative for visual disturbance.  ?Respiratory:  Negative for shortness of breath and wheezing.   ?Gastrointestinal:  Negative for diarrhea and vomiting.  ?Skin:   Positive for itching and rash.  ?Neurological:  Negative for syncope.  ? ?Physical Exam ?Updated Vital Signs ?BP 139/85 (BP Location: Right Arm)   Pulse 66   Temp 97.9 ?F (36.6 ?C) (Oral)   Resp 18   Ht 1.803 m (5\' 11" )   Wt 74.8 kg   SpO2 100%   BMI 23.01 kg/m?  ?Physical Exam ?CONSTITUTIONAL: Well developed/well nourished ?HEAD: Normocephalic/atraumatic ?EYES: EOMI/PERRL, mild periorbital edema ?ENMT: Mucous membranes moist, no angioedema ?NECK: supple no meningeal signs ?CV: S1/S2 noted, no murmurs/rubs/gallops noted ?LUNGS: Lungs are clear to auscultation bilaterally, no apparent distress ?NEURO: Pt is awake/alert/appropriate, moves all extremitiesx4.  No facial droop.   ?EXTREMITIES: full ROM ?SKIN: Rash on extremities and abdomen consistent with contact dermatitis ? ?ED Results / Procedures / Treatments   ?Labs ?(all labs ordered are listed, but only abnormal results are displayed) ?Labs Reviewed - No data to display ? ?EKG ?None ? ?Radiology ?No results found. ? ?Procedures ?Procedures  ? ? ?Medications Ordered in ED ?Medications  ?diphenhydrAMINE (BENADRYL) capsule 25 mg (has no administration in time range)  ?predniSONE (DELTASONE) tablet 60 mg (has no administration in time range)  ? ? ?ED Course/ Medical Decision Making/ A&P ?  ?                        ?  Medical Decision Making ?Risk ?Prescription drug management. ? ? ?Strong suspicion this represents diffuse contact dermatitis after he was hugging a tree and climbed it.  He has no signs of angioedema.  No wheezing.  No current signs of anaphylaxis. ?We will start antihistamine as well as steroids.  Will place on steroid taper.  Patient agreeable with plan.  We discussed strict return precaution ? ? ? ? ? ? ? ?Final Clinical Impression(s) / ED Diagnoses ?Final diagnoses:  ?Allergic reaction, initial encounter  ?Poison ivy dermatitis  ? ? ?Rx / DC Orders ?ED Discharge Orders   ? ?      Ordered  ?  predniSONE (STERAPRED UNI-PAK 21 TAB) 10 MG (21) TBPK  tablet  Daily       ? 11/22/21 0616  ? ?  ?  ? ?  ? ? ?  ?Ripley Fraise, MD ?11/22/21 [phone] ? ?

## 2021-11-24 ENCOUNTER — Telehealth: Payer: Self-pay

## 2021-11-24 NOTE — Telephone Encounter (Signed)
Left message to call office to see how he was doing after ER visit 11-22-21. Asked him to call back or send a MyChart message. ?

## 2022-01-13 IMAGING — US US ABDOMEN COMPLETE
1 series · 14 of 25 positions shown · non-contrast
Comparison: None.

CLINICAL DATA: Right abdominal pain/tenderness over the last 6
days.

EXAM:
ABDOMEN ULTRASOUND COMPLETE

[Series 1: us abdomen complete · 0.20mm/px · 14 of 80 slices shown]
[im 1/80]
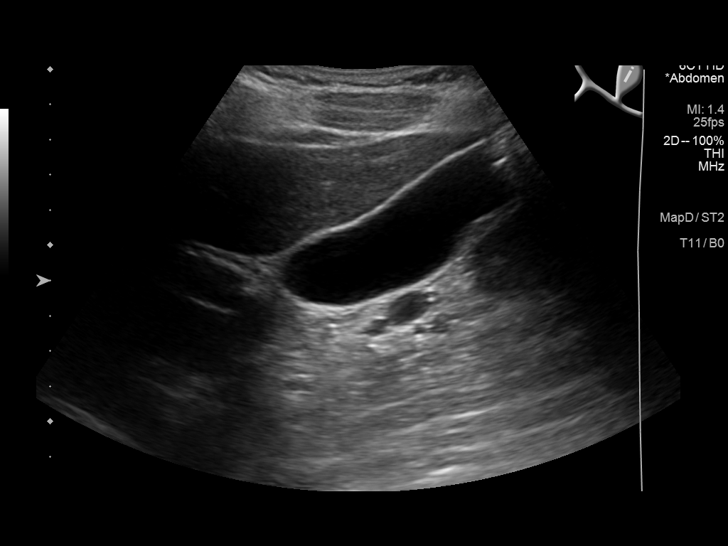
[im 7/80]
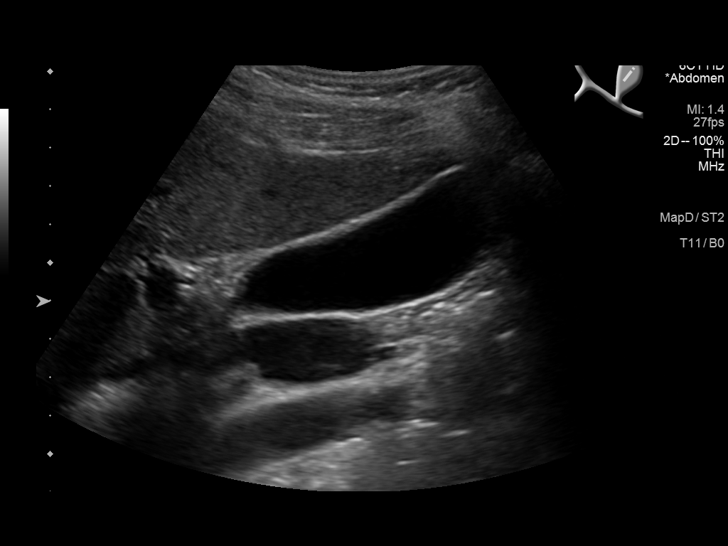
[im 14/80]
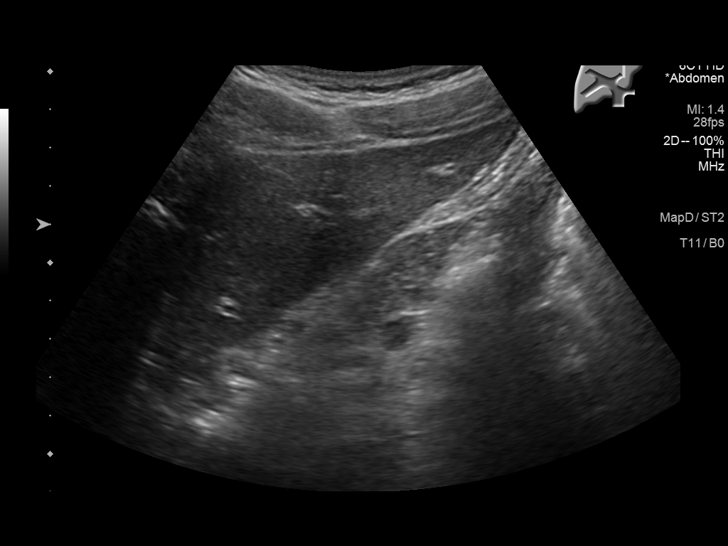
[im 20/80]
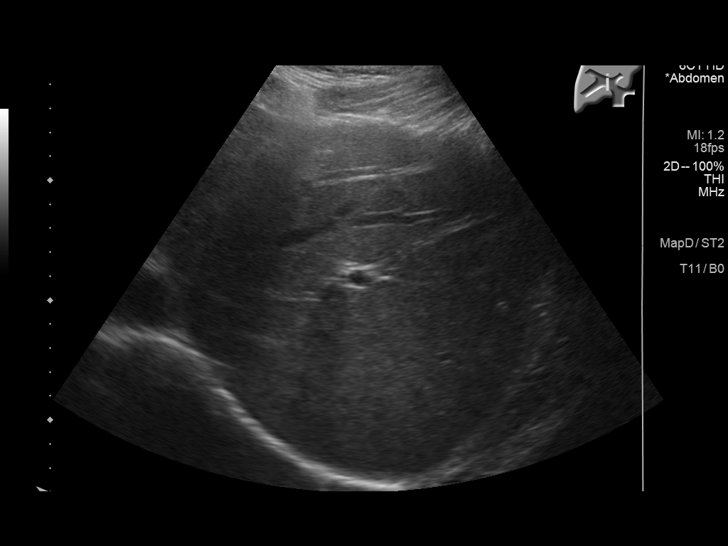
[im 27/80]
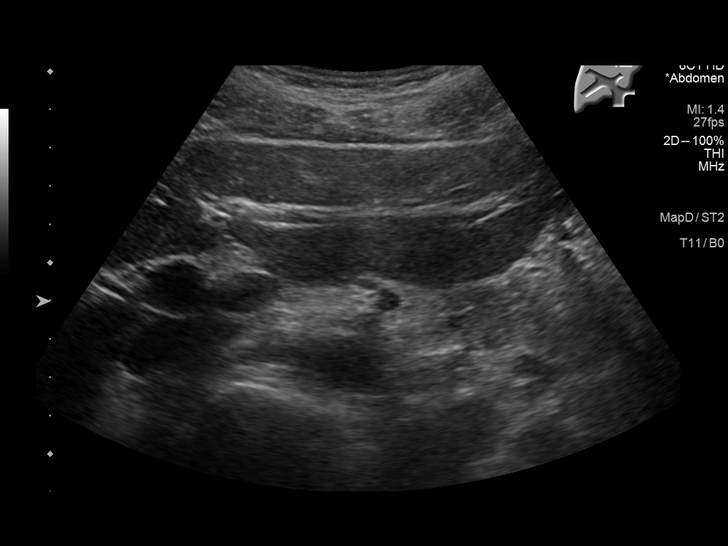
[im 30/80]
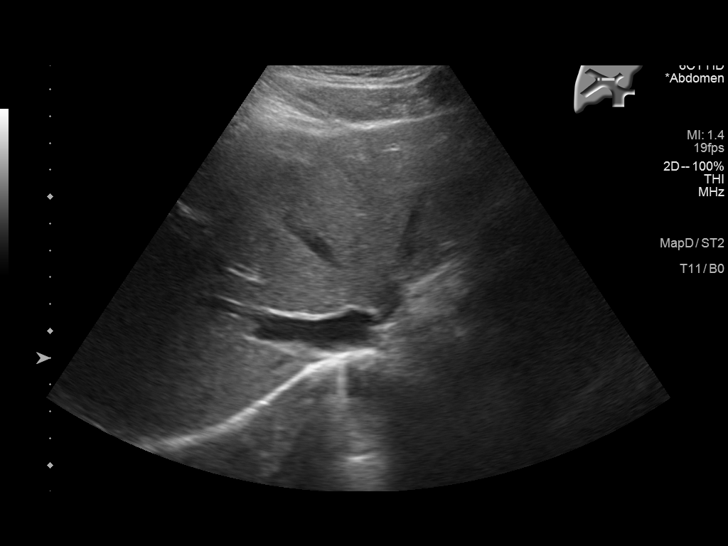
[im 37/80]
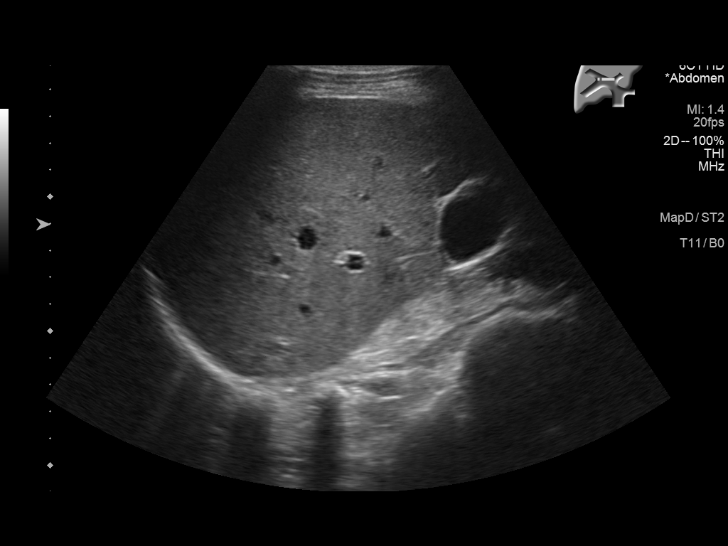
[im 43/80]
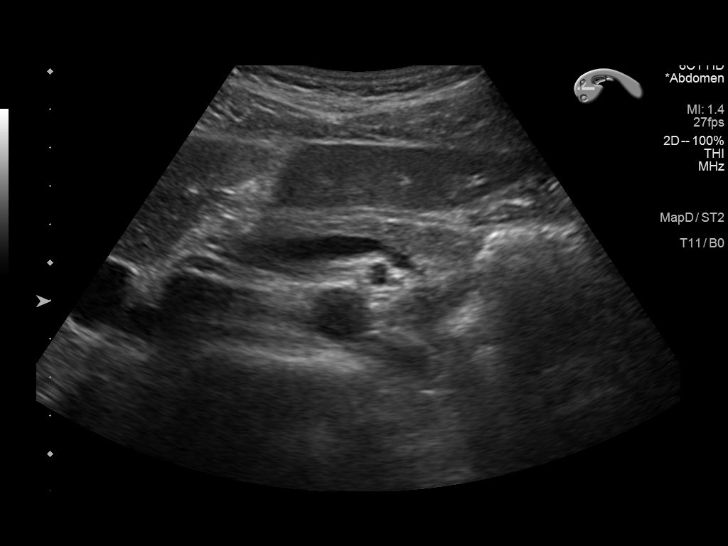
[im 50/80]
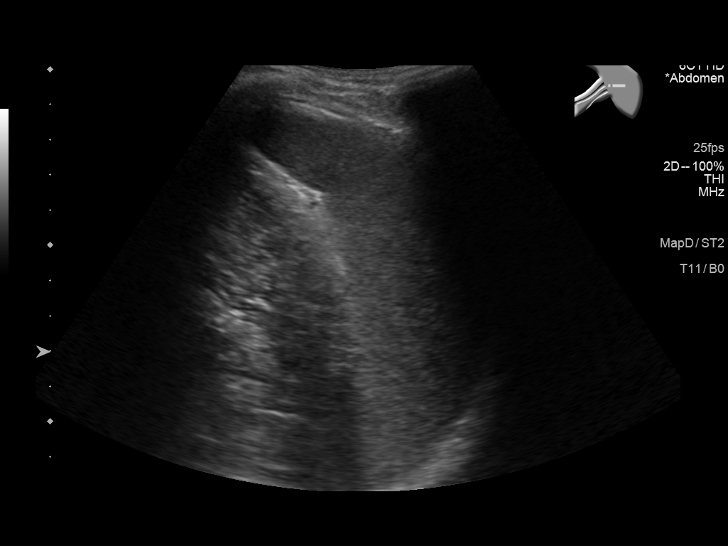
[im 53/80]
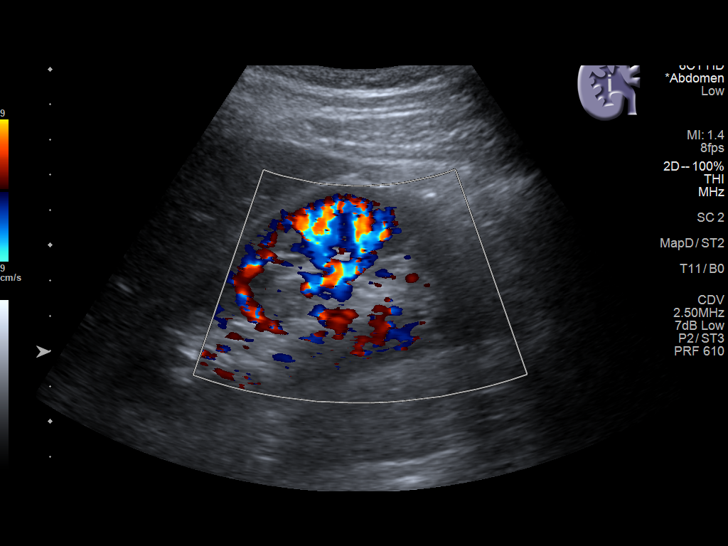
[im 60/80]
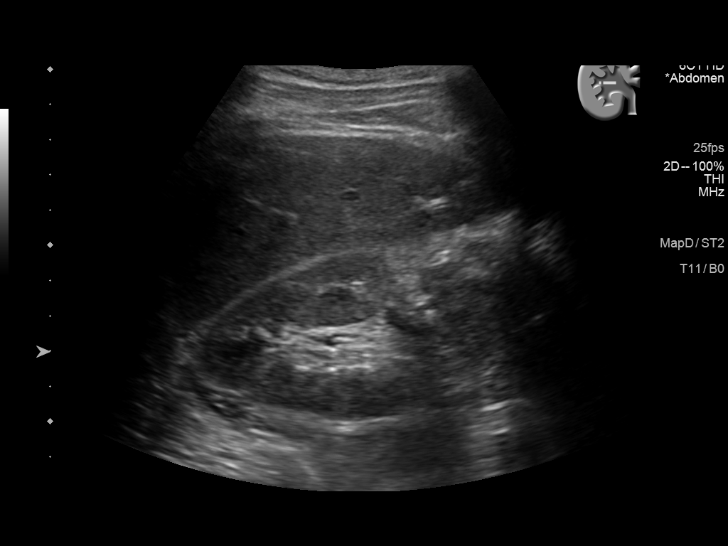
[im 66/80]
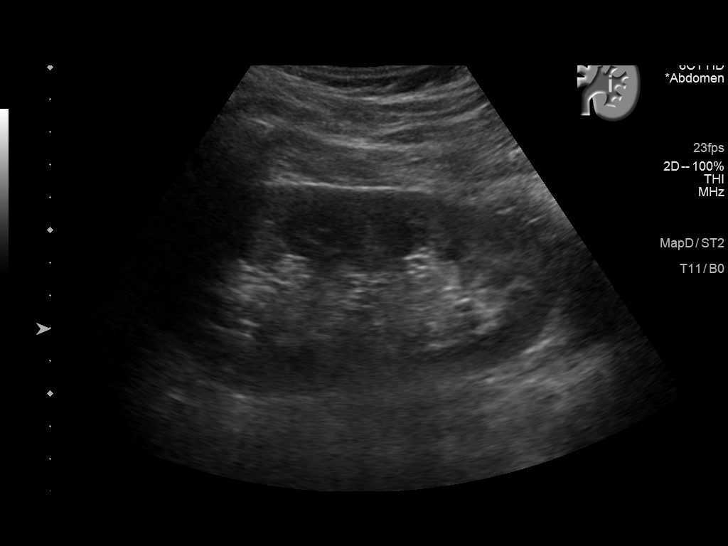
[im 73/80]
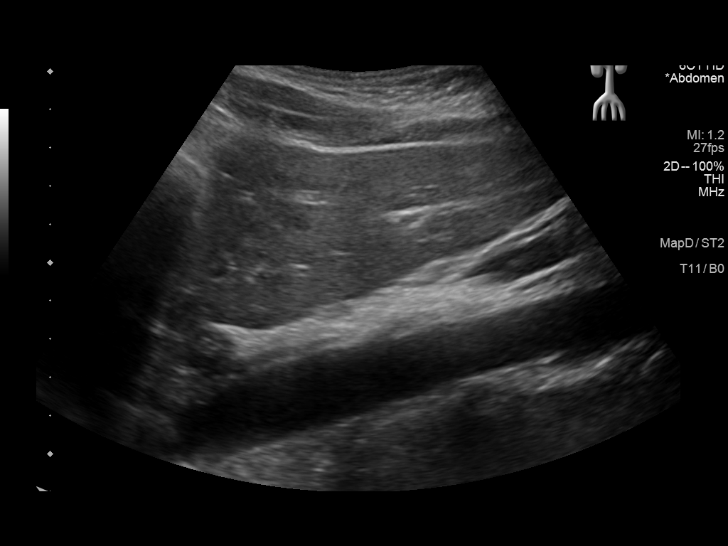
[im 80/80]
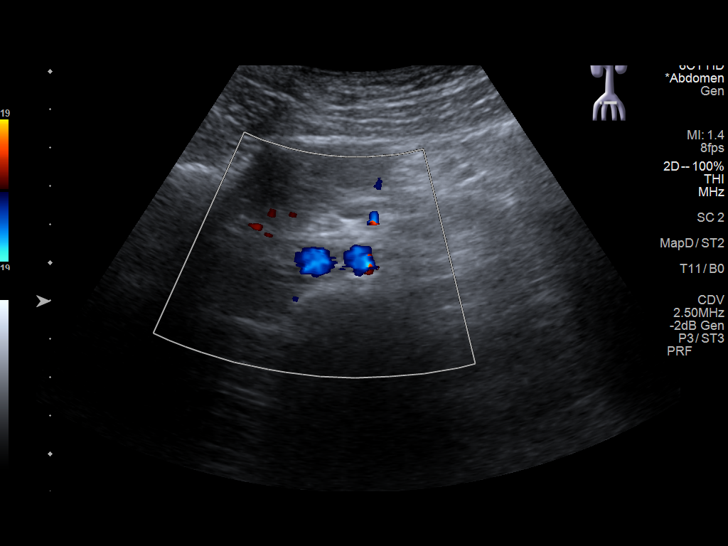

[14 of 25 positions shown; findings below may reference images not displayed]

FINDINGS: Gallbladder: No gallstones or wall thickening visualized. No
sonographic Murphy sign noted by sonographer.

Common bile duct: Diameter: 1 mm

Liver: No focal lesion identified. Within normal limits in
parenchymal echogenicity. Portal vein is patent on color Doppler
imaging with normal direction of blood flow towards the liver.

IVC: No abnormality visualized.

Pancreas: Visualized portion unremarkable.

Spleen: Size and appearance within normal limits.

Right Kidney: Length: 11.6 cm. Echogenicity within normal limits. No
mass or hydronephrosis visualized.

Left Kidney: Length: 12.6 cm. Echogenicity within normal limits. No
mass or hydronephrosis visualized.

Abdominal aorta: No aneurysm visualized.

Other findings: None.
IMPRESSION: 1. No significant sonographic abnormality is identified. Please note
that the appendix was not imaged on today's exam.

## 2022-03-04 ENCOUNTER — Ambulatory Visit
Admission: EM | Admit: 2022-03-04 | Discharge: 2022-03-04 | Disposition: A | Discharge status: Home / Self Care | Payer: Self-pay | Attending: Urgent Care | Admitting: Urgent Care

## 2022-03-04 DIAGNOSIS — R21 Rash and other nonspecific skin eruption: Secondary | ICD-10-CM | POA: Insufficient documentation

## 2022-03-04 DIAGNOSIS — R52 Pain, unspecified: Secondary | ICD-10-CM | POA: Insufficient documentation

## 2022-03-04 DIAGNOSIS — R070 Pain in throat: Secondary | ICD-10-CM | POA: Insufficient documentation

## 2022-03-04 DIAGNOSIS — R509 Fever, unspecified: Secondary | ICD-10-CM | POA: Insufficient documentation

## 2022-03-04 LAB — POCT RAPID STREP A (OFFICE): Rapid Strep A Screen: NEGATIVE

## 2022-03-04 MED ORDER — DOXYCYCLINE HYCLATE 100 MG PO TABS: 100.0000 mg | ORAL_TABLET | Freq: Two times a day (BID) | ORAL | 0 refills | Status: DC

## 2022-03-04 NOTE — ED Provider Notes (Signed)
Max Wright   MRN: [id number] DOB: [birth year]  Subjective:   Max Wright is a 31 y.o. male presenting for 3-day history of acute onset persistent malaise, fatigue, fever, throat pain, oral pain, now started having a rash.  Patient reports that the fever and body aches have improved but the other symptoms remain.  The rash is popped up over his hands and wrists but also feels tingling in his feet.  No cough, chest pain, shortness of breath or wheezing.  Patient is married, has no concern for sexually transmitted infection.  However, [place] and reports that a tick bite is possible but he has not noticed 1 in particular.  No chronic medications.   No Known Allergies  Past Medical History:  Diagnosis Date   Anxiety disorder    GERD (gastroesophageal reflux disease)    Migraine syndrome      History reviewed. No pertinent surgical history.  Family History  Problem Relation Age of Onset   COPD Father    Alcohol abuse Father    Heart disease Father    Depression Father    Diabetes Maternal Uncle    Peptic Ulcer Maternal Grandmother    Peptic Ulcer Maternal Grandfather    Heart disease Other     Social History   Tobacco Use   Smoking status: Every Day    Types: Cigarettes   Smokeless tobacco: Never  Vaping Use   Vaping Use: Never used  Substance Use Topics   Alcohol use: No   Drug use: Never    ROS   Objective:   Vitals: BP (!) 149/79   Pulse 65   Temp 98.2 F (36.8 C)   Resp 18   SpO2 97%   Physical Exam Constitutional:      General: He is not in acute distress.    Appearance: Normal appearance. He is well-developed and normal weight. He is not ill-appearing, toxic-appearing or diaphoretic.  HENT:     Head: Normocephalic and atraumatic.     Right Ear: External ear normal.     Left Ear: External ear normal.     Nose: Nose normal.     Mouth/Throat:     Mouth: Mucous membranes are moist.     Pharynx: Posterior  oropharyngeal erythema present. No pharyngeal swelling, oropharyngeal exudate or uvula swelling.     Tonsils: No tonsillar exudate or tonsillar abscesses. 0 on the right. 0 on the left.  Eyes:     General: No scleral icterus.       Right eye: No discharge.        Left eye: No discharge.     Extraocular Movements: Extraocular movements intact.  Cardiovascular:     Rate and Rhythm: Normal rate and regular rhythm.     Heart sounds: Normal heart sounds. No murmur heard.    No friction rub. No gallop.  Pulmonary:     Effort: Pulmonary effort is normal. No respiratory distress.     Breath sounds: Normal breath sounds. No stridor. No wheezing, rhonchi or rales.  Musculoskeletal:     Cervical back: Normal range of motion.  Lymphadenopathy:     Head:     Right side of head: No submental, submandibular, tonsillar, preauricular, posterior auricular or occipital adenopathy.     Left side of head: No submental, submandibular, tonsillar, preauricular, posterior auricular or occipital adenopathy.     Cervical: No cervical adenopathy.     Right cervical: No superficial, deep or posterior  cervical adenopathy.    Left cervical: No superficial, deep or posterior cervical adenopathy.     Upper Body:     Right upper body: No supraclavicular, axillary, pectoral or epitrochlear adenopathy.     Left upper body: No supraclavicular, axillary, pectoral or epitrochlear adenopathy.  Skin:    Findings: Rash (pinpoint macular lesions scattered over the hands extending toward the wrists; no lesions elsewhere) present.  Neurological:     Mental Status: He is alert and oriented to person, place, and time.  Psychiatric:        Mood and Affect: Mood normal.        Behavior: Behavior normal.        Thought Content: Thought content normal.        Judgment: Judgment normal.    Results for orders placed or performed during the hospital encounter of 03/04/22 (from the past 24 hour(s))  POCT rapid strep A     Status:  None   Collection Time: 03/04/22 12:18 PM  Result Value Ref Range   Rapid Strep A Screen Negative Negative    Assessment and Plan :   PDMP not reviewed this encounter.  1. Rash and nonspecific skin eruption   2. Throat pain   3. Fever, unspecified   4. Body aches    Recommended covering for tickborne illness with doxycycline.  Patient has a rash that suspicious for [facility] spotted fever together with his overall symptoms, negative rapid strep test.  He also believes he is low risk for syphilis but is agreeable to testing.  Labs pending.  An appropriate turnaround time for results was discussed.  Use supportive care otherwise. Counseled patient on potential for adverse effects with medications prescribed/recommended today, ER and return-to-clinic precautions discussed, patient verbalized understanding.    Wallis Bamberg, PA-C 03/04/22 1257

## 2022-03-04 NOTE — Discharge Instructions (Signed)
I am addressing your symptom set for possible tickborne illness with doxycycline.  Lab results are pending.  Most of the labs will be available by Monday.  However the labs for tickborne illness can take more than a week to come back to [place].  We will be on the look out for them and let you know what that shows [place].  In the meantime get plenty of fluids, rest and use Tylenol and/or ibuprofen for any aches or pains.

## 2022-03-04 NOTE — ED Triage Notes (Signed)
Patient presents to Urgent Care with complaints of generalized body rash, sore throat, and fever x 3 days. Not taking any meds.

## 2022-03-05 LAB — HIV ANTIBODY (ROUTINE TESTING W REFLEX): HIV Screen 4th Generation wRfx: NONREACTIVE

## 2022-03-05 LAB — RPR: RPR Ser Ql: NONREACTIVE

## 2022-03-07 LAB — CULTURE, GROUP A STREP (THRC)

## 2022-03-09 LAB — COMPREHENSIVE METABOLIC PANEL
ALT: 17 IU/L (ref 0–44)
AST: 18 IU/L (ref 0–40)
Albumin/Globulin Ratio: 2.1 (ref 1.2–2.2)
Albumin: 5 g/dL (ref 4.1–5.1)
Alkaline Phosphatase: 75 IU/L (ref 44–121)
BUN/Creatinine Ratio: 11 (ref 9–20)
BUN: 9 mg/dL (ref 6–20)
Bilirubin Total: 0.3 mg/dL (ref 0.0–1.2)
CO2: 23 mmol/L (ref 20–29)
Calcium: 9.7 mg/dL (ref 8.7–10.2)
Chloride: 103 mmol/L (ref 96–106)
Creatinine, Ser: 0.79 mg/dL (ref 0.76–1.27)
Globulin, Total: 2.4 g/dL (ref 1.5–4.5)
Glucose: 100 mg/dL — ABNORMAL HIGH (ref 70–99)
Potassium: 4.3 mmol/L (ref 3.5–5.2)
Sodium: 143 mmol/L (ref 134–144)
Total Protein: 7.4 g/dL (ref 6.0–8.5)
eGFR: 121 mL/min/{1.73_m2} (ref >59)

## 2022-03-09 LAB — ROCKY MTN SPOTTED FVR ABS PNL(IGG+IGM)
RMSF IgG: POSITIVE — AB
RMSF IgM: 0.44 index (ref 0.00–0.89)

## 2022-03-09 LAB — CBC WITH DIFFERENTIAL/PLATELET
Basophils Absolute: 0 10*3/uL (ref 0.0–0.2)
Basos: 1 %
EOS (ABSOLUTE): 0.1 10*3/uL (ref 0.0–0.4)
Eos: 2 %
Hematocrit: 47.3 % (ref 37.5–51.0)
Hemoglobin: 16.2 g/dL (ref 13.0–17.7)
Immature Grans (Abs): 0 10*3/uL (ref 0.0–0.1)
Immature Granulocytes: 0 %
Lymphocytes Absolute: 1.2 10*3/uL (ref 0.7–3.1)
Lymphs: 38 %
MCH: 30.5 pg (ref 26.6–33.0)
MCHC: 34.2 g/dL (ref 31.5–35.7)
MCV: 89 fL (ref 79–97)
Monocytes Absolute: 0.2 10*3/uL (ref 0.1–0.9)
Monocytes: 7 %
Neutrophils Absolute: 1.6 10*3/uL (ref 1.4–7.0)
Neutrophils: 52 %
Platelets: 236 10*3/uL (ref 150–450)
RBC: 5.32 x10E6/uL (ref 4.14–5.80)
RDW: 13 % (ref 11.6–15.4)
WBC: 3.1 10*3/uL — ABNORMAL LOW (ref 3.4–10.8)

## 2022-03-09 LAB — EHRLICHIA ANTIBODY PANEL
E. Chaffeensis (HME) IgM Titer: NEGATIVE
E.Chaffeensis (HME) IgG: NEGATIVE
HGE IgG Titer: NEGATIVE
HGE IgM Titer: NEGATIVE

## 2022-03-09 LAB — RMSF, IGG, IFA: RMSF, IGG, IFA: <1:64 {titer}

## 2022-03-09 LAB — LYME DISEASE SEROLOGY W/REFLEX: Lyme Total Antibody EIA: NEGATIVE

## 2022-03-10 ENCOUNTER — Encounter: Payer: Self-pay | Admitting: Internal Medicine

## 2022-09-22 ENCOUNTER — Ambulatory Visit: Payer: Medicaid Other | Admitting: Internal Medicine

## 2022-09-27 ENCOUNTER — Ambulatory Visit
Admission: EM | Admit: 2022-09-27 | Discharge: 2022-09-27 | Disposition: A | Discharge status: Home / Self Care | Payer: Medicaid Other

## 2022-09-27 ENCOUNTER — Other Ambulatory Visit: Payer: Self-pay

## 2022-09-27 ENCOUNTER — Emergency Department
Admission: EM | Admit: 2022-09-27 | Discharge: 2022-09-27 | Disposition: A | Discharge status: Home / Self Care | Payer: Medicaid Other | Attending: Emergency Medicine | Admitting: Emergency Medicine

## 2022-09-27 ENCOUNTER — Emergency Department: Payer: Medicaid Other

## 2022-09-27 ENCOUNTER — Encounter: Payer: Self-pay | Admitting: Intensive Care

## 2022-09-27 DIAGNOSIS — N50811 Right testicular pain: Secondary | ICD-10-CM

## 2022-09-27 DIAGNOSIS — N50819 Testicular pain, unspecified: Secondary | ICD-10-CM | POA: Diagnosis present

## 2022-09-27 DIAGNOSIS — N410 Acute prostatitis: Secondary | ICD-10-CM

## 2022-09-27 DIAGNOSIS — N5082 Scrotal pain: Secondary | ICD-10-CM

## 2022-09-27 LAB — URINALYSIS, W/ REFLEX TO CULTURE (INFECTION SUSPECTED)
Bacteria, UA: NONE SEEN
Bilirubin Urine: NEGATIVE
Glucose, UA: NEGATIVE mg/dL
Hgb urine dipstick: NEGATIVE
Ketones, ur: NEGATIVE mg/dL
Leukocytes,Ua: NEGATIVE
Nitrite: NEGATIVE
Protein, ur: NEGATIVE mg/dL
Specific Gravity, Urine: 1.008 (ref 1.005–1.030)
Squamous Epithelial / HPF: NONE SEEN /HPF (ref 0–5)
pH: 7 (ref 5.0–8.0)

## 2022-09-27 LAB — URINE DRUG SCREEN, QUALITATIVE (ARMC ONLY)
Amphetamines, Ur Screen: NOT DETECTED
Barbiturates, Ur Screen: NOT DETECTED
Benzodiazepine, Ur Scrn: NOT DETECTED
Cannabinoid 50 Ng, Ur ~~LOC~~: POSITIVE — AB
Cocaine Metabolite,Ur ~~LOC~~: NOT DETECTED
MDMA (Ecstasy)Ur Screen: NOT DETECTED
Methadone Scn, Ur: NOT DETECTED
Opiate, Ur Screen: NOT DETECTED
Phencyclidine (PCP) Ur S: NOT DETECTED
Tricyclic, Ur Screen: NOT DETECTED

## 2022-09-27 MED ORDER — SULFAMETHOXAZOLE-TRIMETHOPRIM 800-160 MG PO TABS: 1.0000 | ORAL_TABLET | Freq: Two times a day (BID) | ORAL | 0 refills | Status: AC

## 2022-09-27 NOTE — ED Notes (Signed)
Patient is being discharged from the Urgent Care and sent to the Emergency Department via POV . Per Barkley Boards NP, patient is in need of higher level of care due to testicular pain. Patient is aware and verbalizes understanding of plan of care.  Vitals:   09/27/22 1407  BP: (!) 148/81  Pulse: 68  Resp: 18  Temp: 98.1 F (36.7 C)  SpO2: 98%

## 2022-09-27 NOTE — ED Provider Notes (Signed)
[facility] Provider Note  Patient Contact: 7:21 PM (approximate)   History   Testicle Pain   HPI  Max Wright is a 32 y.o. male who presents the emergency department complaining of hernia, testicle pain.  Patient states that the pain began this morning.  No trauma.  No history of torsion.  He does have a history of prostatitis with exact same symptoms.  Patient denies any dysuria, polyuria, hematuria, penile discharge.  Denies any chance for STD STI.   Incidentally while patient is here, he states that he uses for anxiety and would like to have a UDS to ensure that this would not show up in a drug screen for his employer.     Physical Exam   Triage Vital Signs: ED Triage Vitals  Enc Vitals Group     BP 09/27/22 1449 (!) 137/90     Pulse Rate 09/27/22 1449 70     Resp 09/27/22 1449 20     Temp 09/27/22 1449 98.2 F (36.8 C)     Temp src --      SpO2 09/27/22 1449 100 %     Weight 09/27/22 1450 160 lb (72.6 kg)     Height 09/27/22 1450 5\' 11"  (1.803 m)     Head Circumference --      Peak Flow --      Pain Score 09/27/22 1459 3     Pain Loc --      Pain Edu? --      Excl. in [place]? --     Most recent vital signs: Vitals:   09/27/22 1449  BP: (!) 137/90  Pulse: 70  Resp: 20  Temp: 98.2 F (36.8 C)  SpO2: 100%     General: Alert and in no acute distress.  Cardiovascular:  Good peripheral perfusion Respiratory: Normal respiratory effort without tachypnea or retractions. Lungs CTAB.  Gastrointestinal: Bowel sounds 4 quadrants. Soft and nontender to palpation. No guarding or rigidity. No palpable masses. No distention. No CVA tenderness. Genitourinary: Tenderness about the right testicle without palpable abnormality.  No other visible signs of trauma, lesions or chancres to the genitals.  Palpation of of the inguinal canal reveals no palpable findings consistent with hernia. Musculoskeletal: Full range of motion to all extremities.   Neurologic:  No gross focal neurologic deficits are appreciated.  Skin:   No rash noted Other:   ED Results / Procedures / Treatments   Labs (all labs ordered are listed, but only abnormal results are displayed) Labs Reviewed  URINE DRUG SCREEN, QUALITATIVE ([facility] ONLY)  URINALYSIS, W/ REFLEX TO CULTURE (INFECTION SUSPECTED)     EKG     RADIOLOGY  I personally viewed, evaluated, and interpreted these images as part of my medical decision making, as well as reviewing the written report by the radiologist.  ED Provider Interpretation: No testicular torsion.  Small hydrocele identified  US SCROTUM W/DOPPLER  Result Date: 09/27/2022 CLINICAL DATA:  Scrotal pain EXAM: SCROTAL ULTRASOUND DOPPLER ULTRASOUND OF THE TESTICLES TECHNIQUE: Complete ultrasound examination of the testicles, epididymis, and other scrotal structures was performed. Color and spectral Doppler ultrasound were also utilized to evaluate blood flow to the testicles. COMPARISON:  None Available. FINDINGS: Right testicle Measurements: 4.3 x 2.7 x 3.8 cm. No mass or microlithiasis visualized. Left testicle Measurements: 4 x 2.4 x 3.7 cm. No mass or microlithiasis visualized. There is 5.5 x 5.2 mm exophytic structure in the margin of the left ovary suggesting possible appendix testis. Right  epididymis:  There is 3.7 mm cyst in right epididymis. Left epididymis:  Normal in size and appearance. Hydrocele: Small bilateral hydrocele is seen, more so on the right side. Varicocele:  None visualized. Pulsed Doppler interrogation of both testes demonstrates normal low resistance arterial and venous waveforms bilaterally. IMPRESSION: There is no evidence of testicular torsion. There is homogeneous echogenicity in both testes. Small bilateral hydrocele, larger on the right side. 3.7 mm right epididymal cyst. Electronically Signed   By: Elmer Picker M.D.   On: 09/27/2022 16:29    PROCEDURES:  Critical Care performed:  No  Procedures   MEDICATIONS ORDERED IN ED: Medications - No data to display   IMPRESSION / MDM / [place] / ED COURSE  I reviewed the triage vital signs and the nursing notes.                                 Differential diagnosis includes, but is not limited to, testicular torsion, epididymitis, varicocele, hydrocele, UTI, STI, prostatitis  Patient's presentation is most consistent with acute presentation with potential threat to life or bodily function.   Patient's diagnosis is consistent with prostatitis.  Patient presents emergency department with symptoms consistent with his previous prostatitis.  He is having testicular pain contusion. ..  No dysuria, polyuria, hematuria.  Symptoms again are consistent with his previous prostatitis.  Ultrasound with no evidence of torsion, small hydrocele.  No evidence of varicocele.  Patient has provided urine for [place] but will treat for prostatitis.  Patient should follow-up with primary care.  Return precautions discussed with patient at this time..  Patient is given ED precautions to return to the ED for any worsening or new symptoms.     FINAL CLINICAL IMPRESSION(S) / ED DIAGNOSES   Final diagnoses:  Acute prostatitis     Rx / DC Orders   ED Discharge Orders          Ordered    sulfamethoxazole-trimethoprim (BACTRIM DS) 800-160 MG tablet  2 times daily        09/27/22 1935             Note:  This document was prepared using Dragon voice recognition software and may include unintentional dictation errors.   Darletta Moll, PA-C 09/27/22 1944    Delman Kitten, MD 09/27/22 [phone]

## 2022-09-27 NOTE — ED Notes (Signed)
Pt alert, sitting in chair calmly using personal phone, resp reg/unlabored, skin dry.

## 2022-09-27 NOTE — ED Triage Notes (Signed)
Patient c/o testicular pain/discomfort that started this AM.   Diagnosed with prostatitis and treated with doxycycline around a year ago

## 2022-09-27 NOTE — ED Provider Notes (Signed)
[name]    CSN: [id number] Arrival date & time: 09/27/22  1355      History   Chief Complaint Chief Complaint  Patient presents with   Testicle Pain    HPI Max Wright is a 32 y.o. male.  Patient presents with acute onset of right testicle pain this morning.  No trauma.  He woke up in pain.  No fever, abdominal pain, dysuria, penile discharge, rash, lesions, or other symptoms.  He reports similar pain with episode of prostatitis one year ago.    The history is provided by the patient and medical records.    Past Medical History:  Diagnosis Date   Anxiety disorder    GERD (gastroesophageal reflux disease)    Migraine syndrome     Patient Active Problem List   Diagnosis Date Noted   Prostatitis 08/13/2021   Nicotine dependence 08/13/2021   RUQ abdominal tenderness 01/17/2020   Anxiety disorder    GERD (gastroesophageal reflux disease)    Migraine syndrome     History reviewed. No pertinent surgical history.     Home Medications    Prior to Admission medications   Medication Sig Start Date End Date Taking? Authorizing Provider  doxycycline (VIBRA-TABS) 100 MG tablet Take 1 tablet (100 mg total) by mouth 2 (two) times daily. Patient not taking: Reported on 09/27/2022 03/04/22   Jaynee Eagles, PA-C    Family History Family History  Problem Relation Age of Onset   COPD Father    Alcohol abuse Father    Heart disease Father    Depression Father    Diabetes Maternal Uncle    Peptic Ulcer Maternal Grandmother    Peptic Ulcer Maternal Grandfather    Heart disease Other     Social History Social History   Tobacco Use   Smoking status: Every Day    Types: Cigarettes   Smokeless tobacco: Never  Vaping Use   Vaping Use: Never used  Substance Use Topics   Alcohol use: No   Drug use: Never     Allergies   Patient has no known allergies.   Review of Systems Review of Systems  Constitutional:  Negative for chills and fever.   Gastrointestinal:  Negative for abdominal pain and vomiting.  Genitourinary:  Positive for testicular pain. Negative for dysuria, flank pain, hematuria and penile discharge.  Skin:  Negative for color change and rash.  All other systems reviewed and are negative.    Physical Exam Triage Vital Signs ED Triage Vitals  Enc Vitals Group     BP      Pulse      Resp      Temp      Temp src      SpO2      Weight      Height      Head Circumference      Peak Flow      Pain Score      Pain Loc      Pain Edu?      Excl. in [place]?    No data found.  Updated Vital Signs BP (!) 148/81   Pulse 68   Temp 98.1 F (36.7 C)   Resp 18   SpO2 98%   Visual Acuity Right Eye Distance:   Left Eye Distance:   Bilateral Distance:    Right Eye Near:   Left Eye Near:    Bilateral Near:     Physical Exam Vitals and nursing  note reviewed.  Constitutional:      General: He is not in acute distress.    Appearance: He is well-developed. He is ill-appearing.  HENT:     Mouth/Throat:     Mouth: Mucous membranes are moist.  Cardiovascular:     Rate and Rhythm: Normal rate and regular rhythm.  Pulmonary:     Effort: Pulmonary effort is normal. No respiratory distress.  Genitourinary:    Penis: Normal.      Comments: Right testicle acutely tender to palpation.  Musculoskeletal:     Cervical back: Neck supple.  Skin:    General: Skin is warm and dry.  Neurological:     Mental Status: He is alert.      UC Treatments / Results  Labs (all labs ordered are listed, but only abnormal results are displayed) Labs Reviewed - No data to display  EKG   Radiology No results found.  Procedures Procedures (including critical care time)  Medications Ordered in UC Medications - No data to display  Initial Impression / Assessment and Plan / UC Course  I have reviewed the triage vital signs and the nursing notes.  Pertinent labs & imaging results that were available during my care of  the patient were reviewed by me and considered in my medical decision making (see chart for details).    Right testicular pain.  Patient has acute onset of right testicle pain this morning.  His right testicle is acutely tender to palpation.  Sending him to the ED for evaluation.  He states he is able to drive himself there and declines EMS.    Final Clinical Impressions(s) / UC Diagnoses   Final diagnoses:  Testicular pain, right     Discharge Instructions      Go to the emergency department for evaluation of your testicle pain.       ED Prescriptions   None    PDMP not reviewed this encounter.   Sharion Balloon, NP 09/27/22 1423

## 2022-09-27 NOTE — ED Triage Notes (Signed)
Patient to Urgent Care with complaints of testicular discomfort. Reports symptoms started this morning. Denies any concerns for STDs.   Hx of the same, states he has previously diagnosed with prostatitis treated with doxy.

## 2022-09-27 NOTE — Discharge Instructions (Signed)
Go to the emergency department for evaluation of your testicle pain.

## 2022-09-29 ENCOUNTER — Telehealth: Payer: Self-pay

## 2022-09-29 NOTE — Transitions of Care (Post Inpatient/ED Visit) (Signed)
   09/29/2022  Name: Max Wright MRN: [id number] DOB: 1991-04-27  Today's TOC FU Call Status: Today's TOC FU Call Status:: Successful TOC FU Call Competed TOC FU Call Complete Date: 09/29/22  Attempted to reach the patient regarding the most recent Inpatient/ED visit.  Follow Up Plan: Additional outreach attempts will be made to reach the patient to complete the Transitions of Care (Post Inpatient/ED visit) call.   [place] LPN [place] Advisor Direct Dial [phone]

## 2022-10-04 NOTE — Transitions of Care (Post Inpatient/ED Visit) (Signed)
   10/04/2022  Name: OWENS REMALEY MRN: [id number] DOB: [birth year]  Today's TOC FU Call Status: Today's TOC FU Call Status:: Unsuccessful Call (2nd Attempt) Unsuccessful Call (2nd Attempt) Date: 10/04/22 [facility] FU Call Complete Date: 09/29/22  Attempted to reach the patient regarding the most recent Inpatient/ED visit.  Follow Up Plan: No further outreach attempts will be made at this time. We have been unable to contact the patient.  [place] LPN [place] Advisor Direct Dial [phone]

## 2023-03-02 ENCOUNTER — Encounter: Payer: Self-pay | Admitting: Internal Medicine

## 2023-03-02 ENCOUNTER — Ambulatory Visit (INDEPENDENT_AMBULATORY_CARE_PROVIDER_SITE_OTHER): Payer: Medicaid Other | Admitting: Internal Medicine

## 2023-03-02 VITALS — BP 124/88 | HR 85 | Temp 99.3°F | Ht 70.0 in | Wt 169.0 lb

## 2023-03-02 DIAGNOSIS — J069 Acute upper respiratory infection, unspecified: Secondary | ICD-10-CM

## 2023-03-02 DIAGNOSIS — F319 Bipolar disorder, unspecified: Secondary | ICD-10-CM | POA: Insufficient documentation

## 2023-03-02 MED ORDER — QUETIAPINE FUMARATE 50 MG PO TABS: 50.0000 mg | ORAL_TABLET | Freq: Every day | ORAL | 1 refills | Status: DC

## 2023-03-02 NOTE — Assessment & Plan Note (Signed)
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

## 2023-03-02 NOTE — Assessment & Plan Note (Signed)
COVID negative Mild symptoms Discussed analgesics, cough med prn

## 2023-03-02 NOTE — Progress Notes (Signed)
Subjective:    Patient ID: Max Wright, male    DOB: 09-Apr-1991, 32 y.o.   MRN: [id number]  HPI Here due to issues with anxiety--and with a respiratory infection  Seems to be having worsening anxiety Hard to "go places" Just "want to be sheltered in my own space" Has tried left over hydroxyzine--- helps some but is sedating Has 39 month old baby ---and new stressful job Has had daily symptoms for the past 2 months---forces himself to go to work Hasn't even gone to work for a couple of weeks---needs to put in a leave of absence Engineer, maintenance (IT)) Missed work since 8/14 No financial issues per se---just "tight"  Some depression--- daily for 2 months Interested in counseling Has had thoughts of dying---but not suicide Some worthless feelings Definite trouble concentrating---restless and pacing Long standing attention problems (did try l-tyrosine and vitamins)  Didn't like bupropion Bad reactions with zoloft--and likely citalopram also  Feels he may have had manic spells ---frequent but never more than 1 day at a time Will "crash" after---every time  Mild respiratory symptoms Wouldn't have come in for this No fever or cough No SOB  No current outpatient medications on file prior to visit.   No current facility-administered medications on file prior to visit.    No Known Allergies  Past Medical History:  Diagnosis Date   Anxiety disorder    GERD (gastroesophageal reflux disease)    Migraine syndrome     History reviewed. No pertinent surgical history.  Family History  Problem Relation Age of Onset   COPD Father    Alcohol abuse Father    Heart disease Father    Depression Father    Diabetes Maternal Uncle    Peptic Ulcer Maternal Grandmother    Peptic Ulcer Maternal Grandfather    Heart disease Other     Social History   Socioeconomic History   Marital status: Married    Spouse name: Not on file   Number of children: 7   Years of education: Not on file    Highest education level: Not on file  Occupational History   Occupation: Carpentry on the side   Occupation: Armed forces operational officer    Comment: [employer]  Tobacco Use   Smoking status: Every Day    Types: Cigarettes   Smokeless tobacco: Never  Vaping Use   Vaping status: Some Days   Substances: CBD  Substance and Sexual Activity   Alcohol use: Yes    Alcohol/week: 14.0 standard drinks of alcohol    Types: 14 Cans of beer per week   Drug use: Never   Sexual activity: Not on file  Other Topics Concern   Not on file  Social History Narrative   Not on file   Social Determinants of Health   Financial Resource Strain: Not on file  Food Insecurity: Not on file  Transportation Needs: Not on file  Physical Activity: Not on file  Stress: Not on file  Social Connections: Not on file  Intimate Partner Violence: Not on file   Review of Systems Not sleeping well--has bad dreams.  Only eating once a day--not a big difference Weight is stable     Objective:   Physical Exam Constitutional:      Appearance: Normal appearance.  HENT:     Right Ear: Tympanic membrane and ear canal normal.     Left Ear: Tympanic membrane and ear canal normal.     Mouth/Throat:     Pharynx: No oropharyngeal  exudate or posterior oropharyngeal erythema.  Pulmonary:     Effort: Pulmonary effort is normal.     Breath sounds: Normal breath sounds. No wheezing or rales.  Musculoskeletal:     Cervical back: Neck supple.  Lymphadenopathy:     Cervical: No cervical adenopathy.  Neurological:     Mental Status: He is alert.  Psychiatric:     Comments: Some depression Calm though No psychomotor agitation  Appropriate insight Judgement doesn't appear impaired            Assessment & Plan:

## 2023-03-02 NOTE — Patient Instructions (Signed)
Please start the quetiapine 50mg  tonight. If you don't have any side effects after 3-4 doses, try increasing to 2 tabs nightly. Check with your Employee Assistance Program about counseling (and I have referred you to my group as well). You need to check with work about whether you need to apply for short term disability or FMLA if you will be out for a few weeks.

## 2023-03-09 ENCOUNTER — Ambulatory Visit (INDEPENDENT_AMBULATORY_CARE_PROVIDER_SITE_OTHER): Payer: Medicaid Other | Admitting: Internal Medicine

## 2023-03-09 ENCOUNTER — Encounter: Payer: Self-pay | Admitting: Internal Medicine

## 2023-03-09 VITALS — BP 118/84 | HR 69 | Temp 97.8°F | Ht 70.0 in | Wt 171.0 lb

## 2023-03-09 DIAGNOSIS — F319 Bipolar disorder, unspecified: Secondary | ICD-10-CM | POA: Diagnosis not present

## 2023-03-09 NOTE — Assessment & Plan Note (Addendum)
Notes some degree of "flat" feeling--discussed that this is not surprising Is on the seroquel 100mg  at bedtime May want to cut back to 50/100 if ongoing AM sedation (though usually some caffeine is enough to get him up) Will plan return to work 9/9----has been disabled from this since 8/14 Still plans to find counselor through employer EAP Wants to hold off on psychiatrist

## 2023-03-09 NOTE — Progress Notes (Signed)
   Subjective:    Patient ID: Max Wright, male    DOB: 1991-04-20, 32 y.o.   MRN: [id number]  HPI Here for follow up of bipolar  No side effects with the quetiapine Has some AM sedation--that improved until he increased to the 100mg  No bad dreams Still has anxiety--but manageable Depression is also improved---"at one constant mood"  Is considering taking a different job--at different place Prior boss is trying to get him to come to a different property  He knows that when he moved his job--he was really stressed out trying to get this property straightened The management has not spent the money needed to keep up the property  Current Outpatient Medications on File Prior to Visit  Medication Sig Dispense Refill   QUEtiapine (SEROQUEL) 50 MG tablet Take 1-2 tablets (50-100 mg total) by mouth at bedtime. 60 tablet 1   No current facility-administered medications on file prior to visit.    No Known Allergies  Past Medical History:  Diagnosis Date   Anxiety disorder    GERD (gastroesophageal reflux disease)    Migraine syndrome     History reviewed. No pertinent surgical history.  Family History  Problem Relation Age of Onset   COPD Father    Alcohol abuse Father    Heart disease Father    Depression Father    Diabetes Maternal Uncle    Peptic Ulcer Maternal Grandmother    Peptic Ulcer Maternal Grandfather    Heart disease Other     Social History   Socioeconomic History   Marital status: Married    Spouse name: Not on file   Number of children: 7   Years of education: Not on file   Highest education level: Not on file  Occupational History   Occupation: Carpentry on the side   Occupation: Armed forces operational officer    Comment: [employer]  Tobacco Use   Smoking status: Every Day    Types: Cigarettes   Smokeless tobacco: Never  Vaping Use   Vaping status: Some Days   Substances: CBD  Substance and Sexual Activity   Alcohol use: Yes     Alcohol/week: 14.0 standard drinks of alcohol    Types: 14 Cans of beer per week   Drug use: Never   Sexual activity: Not on file  Other Topics Concern   Not on file  Social History Narrative   Not on file   Social Determinants of Health   Financial Resource Strain: Not on file  Food Insecurity: Not on file  Transportation Needs: Not on file  Physical Activity: Not on file  Stress: Not on file  Social Connections: Not on file  Intimate Partner Violence: Not on file   Review of Systems Appetite is okay Weight is stable    Objective:   Physical Exam Constitutional:      Appearance: Normal appearance.  Neurological:     Mental Status: He is alert.  Psychiatric:     Comments: Much calmer  Not depressed            Assessment & Plan:

## 2023-03-10 ENCOUNTER — Encounter: Payer: Self-pay | Admitting: *Deleted

## 2023-04-10 ENCOUNTER — Ambulatory Visit (INDEPENDENT_AMBULATORY_CARE_PROVIDER_SITE_OTHER): Payer: Medicaid Other | Admitting: Internal Medicine

## 2023-04-10 VITALS — BP 106/80 | HR 50 | Temp 98.3°F | Ht 70.0 in | Wt 173.0 lb

## 2023-04-10 DIAGNOSIS — F319 Bipolar disorder, unspecified: Secondary | ICD-10-CM

## 2023-04-10 MED ORDER — QUETIAPINE FUMARATE 50 MG PO TABS: 50.0000 mg | ORAL_TABLET | Freq: Every day | ORAL | 2 refills | Status: DC

## 2023-04-10 NOTE — Assessment & Plan Note (Addendum)
Doing well now--mostly due to change in job and less stress Mood is level now Discussed weaning slowly down to 50mg  at bedtime---but I think he should continue it Counselor may not be covered--he is doing okay for now---will look into EAP at his new job

## 2023-04-10 NOTE — Progress Notes (Signed)
Subjective:    Patient ID: Max Wright, male    DOB: 1990/11/27, 32 y.o.   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 60 tablet 1   No current facility-administered medications on file prior to visit.    No Known Allergies  Past Medical History:  Diagnosis Date   Anxiety disorder    GERD (gastroesophageal reflux disease)    Migraine syndrome     History reviewed. No pertinent surgical history.  Family History  Problem Relation Age of Onset   COPD Father    Alcohol abuse Father    Heart disease Father    Depression Father    Diabetes Maternal Uncle    Peptic Ulcer Maternal Grandmother    Peptic Ulcer Maternal Grandfather    Heart disease Other     Social History   Socioeconomic History   Marital status: Married    Spouse name: Not on file   Number of children: 7   Years of education: Not on file   Highest education level: GED or equivalent  Occupational History   Occupation: Carpentry on the side   Occupation: Armed forces operational officer    Comment: [employer]  Tobacco Use   Smoking status: Every Day    Types: Cigarettes   Smokeless tobacco: Never  Vaping Use   Vaping status: Some Days   Substances: CBD  Substance and Sexual Activity   Alcohol use: Yes    Alcohol/week: 14.0 standard drinks of alcohol    Types: 14 Cans of beer per week   Drug use: Never   Sexual activity: Not on file  Other Topics Concern   Not on file  Social History Narrative   Not on file   Social Determinants of Health   Financial Resource Strain: Medium Risk  (04/10/2023)   Overall Financial Resource Strain (CARDIA)    Difficulty of Paying Living Expenses: Somewhat hard  Food Insecurity: Food Insecurity Present (04/10/2023)   Hunger Vital Sign    Worried About Running Out of Food in the Last Year: Sometimes true    Ran Out of Food in the Last Year: Never true  Transportation Needs: No Transportation Needs (04/10/2023)   PRAPARE - Administrator, Civil Service (Medical): No    Lack of Transportation (Non-Medical): No  Physical Activity: Unknown (04/10/2023)   Exercise Vital Sign    Days of Exercise per Week: 0 days    Minutes of Exercise per Session: Not on file  Stress: No Stress Concern Present (04/10/2023)   [employer] of Occupational Health - Occupational Stress Questionnaire    Feeling of Stress : Not at all  Social Connections: Moderately Isolated (04/10/2023)   Social Connection and Isolation Panel [NHANES]    Frequency of Communication with Friends and Family: Once a week    Frequency of Social Gatherings with Friends and Family: Never    Attends Religious Services: 1 to 4 times per year    Active Member of [employer] or Organizations: No    Attends Banker Meetings: Not on file  Marital Status: Married  Catering manager Violence: Not on file   Review of Systems Mild sexual problems--?from the medication Appetite is piqued Weight fairly stable Sleeping really well on this    Objective:   Physical Exam Constitutional:      Appearance: Normal appearance.  Neurological:     Mental Status: He is alert.  Psychiatric:        Mood and Affect: Mood normal.        Behavior: Behavior normal.            Assessment & Plan:

## 2023-07-27 ENCOUNTER — Emergency Department (HOSPITAL_COMMUNITY)
Admission: EM | Admit: 2023-07-27 | Discharge: 2023-07-27 | Disposition: A | Discharge status: Home / Self Care | Payer: Medicaid Other | Attending: Emergency Medicine | Admitting: Emergency Medicine

## 2023-07-27 ENCOUNTER — Encounter (HOSPITAL_COMMUNITY): Payer: Self-pay

## 2023-07-27 ENCOUNTER — Ambulatory Visit (HOSPITAL_COMMUNITY)
Admission: EM | Admit: 2023-07-27 | Discharge: 2023-07-27 | Disposition: A | Discharge status: Home / Self Care | Payer: No Typology Code available for payment source | Attending: Emergency Medicine | Admitting: Emergency Medicine

## 2023-07-27 DIAGNOSIS — Z0441 Encounter for examination and observation following alleged adult rape: Secondary | ICD-10-CM | POA: Insufficient documentation

## 2023-07-27 DIAGNOSIS — Y990 Civilian activity done for income or pay: Secondary | ICD-10-CM | POA: Diagnosis not present

## 2023-07-27 DIAGNOSIS — T7421XA Adult sexual abuse, confirmed, initial encounter: Secondary | ICD-10-CM | POA: Insufficient documentation

## 2023-07-27 NOTE — ED Notes (Signed)
SANE has been contacted and states she is coming in for more further evaluation

## 2023-07-27 NOTE — ED Notes (Signed)
SANE at bedside

## 2023-07-27 NOTE — Discharge Instructions (Signed)
Please follow the instructions of the SANE team has given you.  Follow-up with your primary doctor in several days as well.

## 2023-07-27 NOTE — SANE Note (Signed)
SANE/FNE RN is aware of the patient.  If patient needs to void, then please collect a urine specimen and retain in the room with the patient.  If patient denies oral assault then he may eat and / or drink.  SANE/FNE RN will be in to see the patient in approximately 45-60 minutes.
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
## 2023-08-03 ENCOUNTER — Encounter: Payer: Self-pay | Admitting: Internal Medicine

## 2023-08-03 ENCOUNTER — Ambulatory Visit (INDEPENDENT_AMBULATORY_CARE_PROVIDER_SITE_OTHER): Payer: No Typology Code available for payment source | Admitting: Internal Medicine

## 2023-08-03 VITALS — BP 122/80 | HR 62 | Temp 98.0°F | Ht 70.0 in | Wt 164.0 lb

## 2023-08-03 DIAGNOSIS — T7421XD Adult sexual abuse, confirmed, subsequent encounter: Secondary | ICD-10-CM | POA: Diagnosis not present

## 2023-08-03 DIAGNOSIS — T7421XA Adult sexual abuse, confirmed, initial encounter: Secondary | ICD-10-CM | POA: Insufficient documentation

## 2023-08-03 DIAGNOSIS — F319 Bipolar disorder, unspecified: Secondary | ICD-10-CM

## 2023-08-03 MED ORDER — LITHIUM CARBONATE 300 MG PO CAPS: 300.0000 mg | ORAL_CAPSULE | Freq: Two times a day (BID) | ORAL | 3 refills | Status: AC
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
## 2023-09-05 ENCOUNTER — Ambulatory Visit: Payer: No Typology Code available for payment source | Admitting: Internal Medicine

## 2023-10-09 ENCOUNTER — Encounter: Payer: Medicaid Other | Admitting: Internal Medicine

## 2023-10-10 ENCOUNTER — Encounter: Payer: Self-pay | Admitting: Internal Medicine
